# Patient Record
Sex: Female | Born: 1975 | Race: White | Hispanic: No | Smoking: Never smoker
Health system: Southern US, Community
[De-identification: ages and names within clinical notes are randomized; demographics above are authoritative.]

## PROBLEM LIST (undated history)

## (undated) DIAGNOSIS — D649 Anemia, unspecified: Secondary | ICD-10-CM

## (undated) HISTORY — PX: WISDOM TOOTH EXTRACTION: SHX21

---

## 2012-07-28 HISTORY — PX: OTHER SURGICAL HISTORY: SHX169

## 2016-01-02 ENCOUNTER — Ambulatory Visit: Payer: Self-pay | Admitting: Osteopathic Medicine

## 2017-02-27 HISTORY — PX: SINUS SURGERY WITH INSTATRAK: SHX5215

## 2017-04-07 ENCOUNTER — Ambulatory Visit (INDEPENDENT_AMBULATORY_CARE_PROVIDER_SITE_OTHER): Payer: BLUE CROSS/BLUE SHIELD | Admitting: Osteopathic Medicine

## 2017-04-07 ENCOUNTER — Other Ambulatory Visit (HOSPITAL_COMMUNITY)
Admission: RE | Admit: 2017-04-07 | Discharge: 2017-04-07 | Disposition: A | Discharge status: Home / Self Care | Payer: BLUE CROSS/BLUE SHIELD | Source: Ambulatory Visit | Attending: Osteopathic Medicine | Admitting: Osteopathic Medicine

## 2017-04-07 ENCOUNTER — Encounter: Payer: Self-pay | Admitting: Osteopathic Medicine

## 2017-04-07 VITALS — BP 118/78 | HR 96 | Ht 62.0 in | Wt 148.0 lb

## 2017-04-07 DIAGNOSIS — N920 Excessive and frequent menstruation with regular cycle: Secondary | ICD-10-CM | POA: Diagnosis not present

## 2017-04-07 DIAGNOSIS — Z8249 Family history of ischemic heart disease and other diseases of the circulatory system: Secondary | ICD-10-CM | POA: Insufficient documentation

## 2017-04-07 DIAGNOSIS — Z0000 Encounter for general adult medical examination without abnormal findings: Secondary | ICD-10-CM

## 2017-04-07 DIAGNOSIS — R768 Other specified abnormal immunological findings in serum: Secondary | ICD-10-CM | POA: Insufficient documentation

## 2017-04-07 DIAGNOSIS — Z8349 Family history of other endocrine, nutritional and metabolic diseases: Secondary | ICD-10-CM | POA: Diagnosis not present

## 2017-04-07 DIAGNOSIS — R7301 Impaired fasting glucose: Secondary | ICD-10-CM | POA: Diagnosis not present

## 2017-04-07 NOTE — Progress Notes (Addendum)
HPI: Cynthia Weber is a 41 y.o. female  who presents to [facility] today, 04/08/17,  for chief complaint of:  Chief Complaint  Patient presents with  . Establish Care      Patient here for annual physical / wellness exam.  See preventive care reviewed as below.  Recent labs reviewed in detail with the patient - see [place] / [place] records.   Additional concerns today include:  None   Past medical, surgical, social and family history reviewed: Patient Active Problem List   Diagnosis Date Noted  . Menorrhagia with regular cycle 04/07/2017  . Family history of MI (myocardial infarction) 04/07/2017  . Family history of thyroid disease 04/07/2017  . 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 SINUS SURGERY WITH INSTATRAK  02/27/2017  . tummy tuck  2014     Social History  Substance Use Topics  . Smoking status: Never Smoker  . Smokeless tobacco: Never Used  . Alcohol use Not on file   Family History  Problem Relation Age of Onset  . Thyroid cancer Mother   . Heart attack Father      Current medication list and allergy/intolerance information reviewed:   No current outpatient prescriptions on file.   No current facility-administered medications for this visit.    Allergies  Allergen Reactions  . No Known Allergies       Review of Systems:  Constitutional:  No  fever, no chills, No recent illness, No unintentional weight changes. No significant fatigue.   HEENT: No  headache, no vision change, no hearing change, No sore throat, No  sinus pressure  Cardiac: No  chest pain, No  pressure, No palpitations, No  Orthopnea  Respiratory:  No  shortness of breath. No  Cough  Gastrointestinal: No  abdominal pain, No  nausea, No  vomiting,  No  blood in stool, No  diarrhea, No  constipation   Musculoskeletal: No new myalgia/arthralgia  Genitourinary:  No  incontinence, +abnormal genital bleeding - heavy periods lately, No abnormal genital discharge  Skin: No  Rash, No other wounds/concerning lesions  Hem/Onc: No  easy bruising/bleeding, No  abnormal lymph node  Endocrine: No cold intolerance,  No heat intolerance. No polyuria/polydipsia/polyphagia   Neurologic: No  weakness, No  dizziness, No  slurred speech/focal weakness/facial droop  Psychiatric: No  concerns with depression, No  concerns with anxiety, No sleep problems, No mood problems  Exam:  BP 118/78   Pulse 96   Ht 5\' 2"  (1.575 m)   Wt 148 lb (67.1 kg)   BMI 27.07 kg/m   Constitutional: VS see above. General Appearance: alert, well-developed, well-nourished, NAD  Eyes: Normal lids and conjunctive, non-icteric sclera  Ears, Nose, Mouth, Throat: MMM, Normal external inspection ears/nares/mouth/lips/gums. TM normal bilaterally. Pharynx/tonsils no erythema, no exudate. Nasal mucosa normal.   Neck: No masses, trachea midline. No thyroid enlargement. No tenderness/mass appreciated. No lymphadenopathy  Respiratory: Normal respiratory effort. no wheeze, no rhonchi, no rales  Cardiovascular: S1/S2 normal, no murmur, no rub/gallop auscultated. RRR. No lower extremity edema.  Gastrointestinal: Nontender, no masses. No hepatomegaly, no splenomegaly. No hernia appreciated. Bowel sounds normal. Rectal exam deferred.   Musculoskeletal: Gait normal. No clubbing/cyanosis of digits.   Neurological: Normal balance/coordination. No tremor. No cranial nerve deficit on limited exam. Motor and sensation intact and symmetric. Cerebellar reflexes intact.   Skin: warm, dry, intact. No rash/ulcer. No concerning nevi or subq  nodules on limited exam.    Psychiatric: Normal judgment/insight. Normal mood and affect. Oriented x3.  GYN: No lesions/ulcers to external genitalia, normal urethra, normal vaginal mucosa, physiologic discharge, cervix normal without lesions, uterus not enlarged or  tender, adnexa no masses and nontender  BREAST: No rashes/skin changes, normal fibrous breast tissue, no masses or tenderness, normal nipple without discharge, normal axilla     ASSESSMENT/PLAN:   Annual physical exam - Plan: CBC, COMPLETE METABOLIC PANEL WITH GFR, Lipid panel, TSH, T4, free, Cytology - PAP  Menorrhagia with regular cycle  Family history of MI (myocardial infarction)  Family history of thyroid disease  HSV-2 seropositive - w/o hx outbreak - no viral culture of active lesion    FEMALE PREVENTIVE CARE Updated 04/08/17   ANNUAL SCREENING/COUNSELING  Diet/Exercise - HEALTHY HABITS DISCUSSED TO DECREASE CV RISK History  Smoking Status  . Never Smoker  Smokeless Tobacco  . Never Used   History  Alcohol use Not on file   Depression screen [facility] 2/9 04/07/2017  Decreased Interest 0  Down, Depressed, Hopeless 0  PHQ - 2 Score 0    Domestic violence concerns - no  HTN SCREENING - SEE VITALS  SEXUAL HEALTH  Need/want STI testing today? - no  Concerns about libido or pain with sex? - no  Plans for pregnancy? - no  INFECTIOUS DISEASE SCREENING  HIV - does not need  GC/CT - does not need  HepC - DOB 1944-1965 - does not need  TB - does not need  DISEASE SCREENING  Lipid - needs  DM2 - needs  Osteoporosis - women age 39+ - does not need  CANCER SCREENING  Cervical - needs  Breast - does not need  Lung - does not need  Colon - does not need  ADULT VACCINATION  Influenza - annual vaccine recommended  Td - booster every 10 years   Zoster - option at 68, yes at 60+   PCV13 - was not indicated  PPSV23 - was not indicated  There is no immunization history on file for this patient.   Patient Instructions  Plan:  Labs and Pap today - results should be back within a week  Start mammograms at 37 or can wait until age 80   Can try Ibuprofen 800 mg every 6-8 hours just prior to and during periods - if persistent heavy bleeding  or anemia, we should consider a few months on birth control pills     Visit summary with medication list and pertinent instructions was printed for patient to review. All questions at time of visit were answered - patient instructed to contact office with any additional concerns. ER/RTC precautions were reviewed with the patient. Follow-up plan: Return in about 1 year (around 04/07/2018) for [employer], SOONER IF NEEDED FOR ANY PROBLEMS WHICH MAY ARISE! Marland Kitchen

## 2017-04-07 NOTE — Patient Instructions (Signed)
Plan:  Labs and Pap today - results should be back within a week  Start mammograms at 23 or can wait until age 41   Can try Ibuprofen 800 mg every 6-8 hours just prior to and during periods - if persistent heavy bleeding or anemia, we should consider a few months on birth control pills

## 2017-04-08 ENCOUNTER — Encounter: Payer: Self-pay | Admitting: Osteopathic Medicine

## 2017-04-09 ENCOUNTER — Encounter: Payer: Self-pay | Admitting: Osteopathic Medicine

## 2017-04-09 LAB — COMPLETE METABOLIC PANEL WITH GFR
AG Ratio: 1.8 (calc) (ref 1.0–2.5)
ALKALINE PHOSPHATASE (APISO): 68 U/L (ref 33–115)
ALT: 28 U/L (ref 6–29)
AST: 24 U/L (ref 10–30)
Albumin: 4.5 g/dL (ref 3.6–5.1)
BILIRUBIN TOTAL: 0.6 mg/dL (ref 0.2–1.2)
BUN: 10 mg/dL (ref 7–25)
CHLORIDE: 106 mmol/L (ref 98–110)
CO2: 24 mmol/L (ref 20–32)
Calcium: 9.7 mg/dL (ref 8.6–10.2)
Creat: 0.82 mg/dL (ref 0.50–1.10)
GFR, Est African American: 104 mL/min/{1.73_m2} (ref >=60)
GFR, Est Non African American: 90 mL/min/{1.73_m2} (ref >=60)
GLUCOSE: 100 mg/dL — AB (ref 65–99)
Globulin: 2.5 g/dL (calc) (ref 1.9–3.7)
Potassium: 4.3 mmol/L (ref 3.5–5.3)
Sodium: 139 mmol/L (ref 135–146)
Total Protein: 7 g/dL (ref 6.1–8.1)

## 2017-04-09 LAB — CBC
HCT: 36.2 % (ref 35.0–45.0)
Hemoglobin: 12 g/dL (ref 11.7–15.5)
MCH: 29.6 pg (ref 27.0–33.0)
MCHC: 33.1 g/dL (ref 32.0–36.0)
MCV: 89.2 fL (ref 80.0–100.0)
MPV: 12 fL (ref 7.5–12.5)
PLATELETS: 288 10*3/uL (ref 140–400)
RBC: 4.06 10*6/uL (ref 3.80–5.10)
RDW: 12.6 % (ref 11.0–15.0)
WBC: 6 10*3/uL (ref 3.8–10.8)

## 2017-04-09 LAB — LIPID PANEL
CHOLESTEROL: 160 mg/dL (ref <200)
HDL: 39 mg/dL — ABNORMAL LOW (ref >50)
LDL CHOLESTEROL (CALC): 106 mg/dL — AB
Non-HDL Cholesterol (Calc): 121 mg/dL (calc) (ref <130)
TRIGLYCERIDES: 64 mg/dL (ref <150)
Total CHOL/HDL Ratio: 4.1 (calc) (ref <5.0)

## 2017-04-09 LAB — CYTOLOGY - PAP
Diagnosis: NEGATIVE
HPV: NOT DETECTED

## 2017-04-09 LAB — TEST AUTHORIZATION

## 2017-04-09 LAB — HEMOGLOBIN A1C W/OUT EAG: Hgb A1c MFr Bld: 4.8 % of total Hgb (ref <5.7)

## 2017-04-09 LAB — TSH: TSH: 1.24 mIU/L

## 2017-04-09 LAB — T4, FREE: FREE T4: 1.1 ng/dL (ref 0.8–1.8)

## 2017-04-15 ENCOUNTER — Encounter: Payer: Self-pay | Admitting: Family Medicine

## 2017-04-15 ENCOUNTER — Ambulatory Visit (INDEPENDENT_AMBULATORY_CARE_PROVIDER_SITE_OTHER): Payer: BLUE CROSS/BLUE SHIELD | Admitting: Family Medicine

## 2017-04-15 VITALS — BP 123/86 | HR 87 | Wt 147.0 lb

## 2017-04-15 DIAGNOSIS — M25561 Pain in right knee: Secondary | ICD-10-CM | POA: Diagnosis not present

## 2017-04-15 DIAGNOSIS — G5623 Lesion of ulnar nerve, bilateral upper limbs: Secondary | ICD-10-CM

## 2017-04-15 DIAGNOSIS — M25562 Pain in left knee: Secondary | ICD-10-CM | POA: Diagnosis not present

## 2017-04-15 DIAGNOSIS — M62838 Other muscle spasm: Secondary | ICD-10-CM

## 2017-04-15 MED ORDER — GABAPENTIN 300 MG PO CAPS: ORAL_CAPSULE | ORAL | 3 refills | Status: DC

## 2017-04-15 NOTE — Patient Instructions (Addendum)
Thank you for coming in today. I suspect your symptoms are due to Cubital Tunnel Syndrome.  Work on the night splints on your elbow.  Use the towel method or an elbow pad turned around.   Take gabapentin mostly at night.   Recheck with me in 1 month or sooner if needed.   You can call [facility] now and schedule nerve study.  [phone]  Cubital Tunnel Syndrome Cubital tunnel syndrome is a condition that causes pain and weakness of the forearm and hand. This condition happens when one of the nerves (ulnar nerve) that runs alongside the elbow joint becomes irritated. What are the causes? Causes of this condition include:  Increased pressure on the ulnar nerve at the elbow, arm, or forearm. This can be caused by: ? Swollen tissues. ? Ligaments. ? Muscles. ? Poorly healed elbow fractures. ? Tumors in the elbow. These are usually noncancerous (benign). ? Scar tissue that develops in the elbow after an injury. ? Bony growths (spurs) near the ulnar nerve.  Stretching of the nerve due to loose elbow ligaments.  Trauma to the nerve at the elbow.  Repetitive elbow bending.  Certain medical conditions.  What increases the risk? This condition is more likely to develop in:  People who do manual labor that requires frequently bending the elbow.  People who play sports that include repeated or strenuous throwing motions, such as baseball.  People who play contact sports, such as football or lacrosse.  People who do not warm up properly before activities.  People who have diabetes.  People who have an underactive thyroid (hypothyroidism).  What are the signs or symptoms? Symptoms of this condition include:  Clumsiness or weakness of the hand.  Tenderness of the inner elbow.  Aching or soreness of the inner elbow, forearm, or fingers, especially the little finger or the ring finger.  Increased pain with forced elbow bending.  Reduced control when  throwing.  Tingling, numbness, or burning inside the forearm, or in part of the hand or fingers, especially the little finger or the ring finger.  Sharp pains that shoot from the elbow down to the wrist and hand.  The inability to grip or pinch hard.  How is this diagnosed? This condition is diagnosed with a medical history and physical exam. Your health care provider will ask about your symptoms and ask for details about any injury. You may also have other tests, including:  Electromyogram (EMG). This test checks how well the nerve is working.  X-ray.  How is this treated? Treatment starts by stopping the activities that are causing your symptoms to get worse. Treatment may include the use of icing and medicines to reduce pain and swelling. You may also be advised to wear a splint to prevent your elbow from bending or wear an elbow pad where the ulnar nerve is closest to the skin. In less severe cases, treatment may also include working with a physical therapist:  To help decrease your symptoms.  To improve the strength and range of motion of your elbow, forearm, and hand.  If the treatments described above do not help, surgery may be needed. Follow these instructions at home: If you have a splint:  Wear it as told by your health care provider. Remove it only as told by your health care provider.  Loosen the splint if your fingers become numb and tingle, or if they turn cold and blue.  Keep the splint clean and dry. Managing pain, stiffness, and  swelling  If directed, apply ice to the injured area: ? Put ice in a plastic bag. ? Place a towel between your skin and the bag. ? Leave the ice on for 20 minutes, 2-3 times per day.  Move your fingers often to avoid stiffness and to lessen swelling.  Raise (elevate) the injured area above the level of your heart while you are sitting or lying down. General instructions  Take over-the-counter and prescription medicines only as told  by your health care provider.  Keep all follow-up visits as told by your health care provider. This is important.  Do any exercise or physical therapy as told by your health care provider.  Do not drive or operate heavy machinery while taking prescription pain medicine.  If you were given an elbow pad, wear it as told by your health care provider. Contact a health care provider if:  Your symptoms get worse.  Your symptoms do not get better with treatment.  Your have new pain.  Your hand on the injured side feels numb or cold. This information is not intended to replace advice given to you by your health care provider. Make sure you discuss any questions you have with your health care provider. Document Released: 07/14/2005 Document Revised: 12/20/2015 Document Reviewed: 09/20/2014 Elsevier Interactive Patient Education  [employer].
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
## 2017-04-23 ENCOUNTER — Encounter (INDEPENDENT_AMBULATORY_CARE_PROVIDER_SITE_OTHER): Payer: Self-pay | Admitting: Diagnostic Neuroimaging

## 2017-04-23 ENCOUNTER — Ambulatory Visit (INDEPENDENT_AMBULATORY_CARE_PROVIDER_SITE_OTHER): Payer: BLUE CROSS/BLUE SHIELD | Admitting: Diagnostic Neuroimaging

## 2017-04-23 DIAGNOSIS — M25562 Pain in left knee: Secondary | ICD-10-CM

## 2017-04-23 DIAGNOSIS — G5623 Lesion of ulnar nerve, bilateral upper limbs: Secondary | ICD-10-CM

## 2017-04-23 DIAGNOSIS — Z0289 Encounter for other administrative examinations: Secondary | ICD-10-CM

## 2017-04-23 DIAGNOSIS — M25561 Pain in right knee: Secondary | ICD-10-CM

## 2017-04-24 NOTE — Procedures (Signed)
[facility]  NCS (NERVE CONDUCTION STUDY) WITH EMG (ELECTROMYOGRAPHY) REPORT   STUDY DATE: 04/23/17 PATIENT NAME: Cynthia Weber DOB: Apr 22, 1976 MRN: [id number]  ORDERING CLINICIAN: Lynne Leader, MD  TECHNOLOGIST: Oneita Jolly ELECTROMYOGRAPHER: Earlean Polka. Margreat Widener, MD  CLINICAL INFORMATION: 41 year old female with intermittent numbness and pain in bilateral upper extremities. Patient has left elbow pain. Evaluate for left cubital tunnel syndrome or other explanation of bilateral upper extremity symptoms.  FINDINGS: NERVE CONDUCTION STUDY: Bilateral median motor responses are normal. Bilateral ulnar motor responses are normal.  Right median sensory response has slightly prolonged peak latency and slightly decreased amplitude.  Left median and bilateral ulnar sensory responses are normal.  Right median to ulnar transcarpal comparison study shows prolonged peak latency difference.  Left median to ulnar transcarpal comparison study is normal.   NEEDLE ELECTROMYOGRAPHY:  Needle examination of left upper extremity deltoid, biceps, triceps, flexor carpi radialis, first dorsal interosseous and left cervical paraspinal muscles are normal.   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 - No electrodiagnostic evidence of LEFT cubital tunnel syndrome as queried. No evidence of neuropathy or cervical radiculopathy affecting the left upper extremity.     INTERPRETING PHYSICIAN:  Penni Bombard, MD Certified in Neurology, Neurophysiology and Neuroimaging  [facility] [address], [place], [place] [address] [phone]   [facility]    Nerve / Sites Muscle Latency Ref. Amplitude Ref. Rel Amp Segments Distance Velocity Ref. Area    ms ms mV mV %  cm m/s m/s mVms  R Median - APB     Wrist APB 4.0 ?4.4 6.4 ?4.0 100 Wrist - APB 7   23.0    Upper arm APB 7.4  6.2  96.9 Upper arm - Wrist 20 58 ?49 21.3  L Median - APB     Wrist APB 3.3 ?4.4 7.8 ?4.0 100 Wrist - APB 7   31.8     Upper arm APB 6.6  7.3  94.2 Upper arm - Wrist 20 61 ?49 30.4  R Ulnar - ADM     Wrist ADM 2.2 ?3.3 11.0 ?6.0 100 Wrist - ADM 7   45.7     B.Elbow ADM 5.2  10.5  95.4 B.Elbow - Wrist 18 61 ?49 44.4     A.Elbow ADM 6.8  9.7  92.8 A.Elbow - B.Elbow 10 62 ?49 42.3         A.Elbow - Wrist      L Ulnar - ADM     Wrist ADM 2.3 ?3.3 11.0 ?6.0 100 Wrist - ADM 7   38.0     B.Elbow ADM 5.3  9.2  83.6 B.Elbow - Wrist 18 61 ?49 34.3     A.Elbow ADM 7.1  9.0  98 A.Elbow - B.Elbow 10 53 ?49 31.9         A.Elbow - Wrist                 SNC    Nerve / Sites Rec. Site Peak Lat Ref.  Amp Ref. Segments Distance Peak Diff Ref.    ms ms V V  cm ms ms  R Median, Ulnar - Transcarpal comparison     Median Palm Wrist 2.5 ?2.2 55 ?35 Median Palm - Wrist 8       Ulnar Palm Wrist 1.9 ?2.2 27 ?12 Ulnar Palm - Wrist 8  Median Palm - Ulnar Palm  0.6 ?0.4  L Median, Ulnar - Transcarpal comparison     Median Palm Wrist 2.1 ?2.2 47 ?35 Median Palm - Wrist 8       Ulnar Palm Wrist 1.9 ?2.2 24 ?12 Ulnar Palm - Wrist 8          Median Palm - Ulnar Palm  0.2 ?0.4  R Median - Orthodromic (Dig II, Mid palm)     Dig II Wrist 3.5 ?3.4 9 ?10 Dig II - Wrist 13    L Median - Orthodromic (Dig II, Mid palm)     Dig II Wrist 2.9 ?3.4 13 ?10 Dig II - Wrist 13    R Ulnar - Orthodromic, (Dig V, Mid palm)     Dig V Wrist 2.4 ?3.1 16 ?5 Dig V - Wrist 11    L Ulnar - Orthodromic, (Dig V, Mid palm)     Dig V Wrist 2.6 ?3.1 11 ?5 Dig V - Wrist 20                   F  Wave    Nerve F Lat Ref.   ms ms  R Ulnar - ADM 25.3 ?32.0  L Ulnar - ADM 25.2 ?32.0         EMG full       EMG Summary Table    Spontaneous MUAP Recruitment  Muscle IA Fib PSW Fasc Other Amp Dur. Poly Pattern  L. Deltoid Normal None None None _______ Normal Normal Normal Normal  L. Biceps brachii Normal None  None None _______ Normal Normal Normal Normal  L. Triceps brachii Normal None None None _______ Normal Normal Normal Normal  L. Flexor carpi radialis Normal None None None _______ Normal Normal Normal Normal  L. First dorsal interosseous Normal None None None _______ Normal Normal Normal Normal  L. Cervical paraspinals Normal None None None _______ Normal Normal Normal Normal

## 2017-04-27 ENCOUNTER — Encounter: Payer: Self-pay | Admitting: Podiatry

## 2017-04-27 ENCOUNTER — Telehealth: Payer: Self-pay | Admitting: Family Medicine

## 2017-04-27 ENCOUNTER — Ambulatory Visit (INDEPENDENT_AMBULATORY_CARE_PROVIDER_SITE_OTHER): Payer: BLUE CROSS/BLUE SHIELD | Admitting: Podiatry

## 2017-04-27 VITALS — BP 136/83 | HR 83 | Ht 62.0 in | Wt 145.0 lb

## 2017-04-27 DIAGNOSIS — M5412 Radiculopathy, cervical region: Secondary | ICD-10-CM

## 2017-04-27 DIAGNOSIS — M7741 Metatarsalgia, right foot: Secondary | ICD-10-CM

## 2017-04-27 DIAGNOSIS — M21969 Unspecified acquired deformity of unspecified lower leg: Secondary | ICD-10-CM | POA: Diagnosis not present

## 2017-04-27 DIAGNOSIS — M216X2 Other acquired deformities of left foot: Secondary | ICD-10-CM

## 2017-04-27 DIAGNOSIS — M216X1 Other acquired deformities of right foot: Secondary | ICD-10-CM | POA: Diagnosis not present

## 2017-04-27 NOTE — Patient Instructions (Signed)
Seen for pain in ball of right foot. Noted of hyermobile first ray bilateral. Metatarsal binder dispensed. Return for custom orthotics.

## 2017-04-27 NOTE — Progress Notes (Signed)
SUBJECTIVE: 41 y.o. year old female presents complaining of pain under the ball of right foot at about 2nd and 3rd metatarsophalangeal joint area duration of a few months. The pain can radiate proximally to the foot. Happens about every other day. But tenderness is always present. Does exercise everyday.  REVIEW OF SYSTEMS: Pertinent items noted in HPI and remainder of comprehensive ROS otherwise negative.  OBJECTIVE: DERMATOLOGIC EXAMINATION: Normal findings.  VASCULAR EXAMINATION OF LOWER LIMBS: All pedal pulses are palpable with normal pulsation.  Capillary Filling times within 3 seconds in all digits.  Temperature gradient from tibial crest to dorsum of foot is within normal bilateral.  NEUROLOGIC EXAMINATION OF THE LOWER LIMBS: All epicritic and tactile sensations grossly intact. Sharp and Dull discriminatory sensations at the plantar ball of hallux is intact bilateral.   MUSCULOSKELETAL EXAMINATION: Positive for high arched foot with excess sagittal plane motion of the first ray bilateral. Plantar flexed 2-3 MPJ bilateral with pain in right foot with weight bearing.   ASSESSMENT: Hypermobile first ray bilateral with lateral weight shifting. Lesser metatarsalgia right. Compensated rearfoot varus bilateral.  PLAN: Reviewed findings and available treatment options. Metatarsal binder dispensed to add stability of mid foot. Proper shoe gear reviewed. May benefit from custom orthotics. Patient will return for custom orthotics.

## 2017-04-27 NOTE — Telephone Encounter (Signed)
Pt came in the office today and would like to know if you have seen her notes from neuro and if so are you going to order and MRI. If so she would like a referral sent so she can go ahead and get it done before her follow up appointment with you on Oct. 15th. Thanks

## 2017-04-28 NOTE — Telephone Encounter (Signed)
Cervical MRI ordered.

## 2017-04-28 NOTE — Telephone Encounter (Signed)
Left message advising of order.

## 2017-04-29 ENCOUNTER — Ambulatory Visit (INDEPENDENT_AMBULATORY_CARE_PROVIDER_SITE_OTHER): Payer: BLUE CROSS/BLUE SHIELD | Admitting: Physical Therapy

## 2017-04-29 ENCOUNTER — Encounter: Payer: Self-pay | Admitting: Physical Therapy

## 2017-04-29 DIAGNOSIS — M6281 Muscle weakness (generalized): Secondary | ICD-10-CM

## 2017-04-29 DIAGNOSIS — R29898 Other symptoms and signs involving the musculoskeletal system: Secondary | ICD-10-CM
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
## 2017-04-29 NOTE — Patient Instructions (Signed)
  Scapula Adduction With Pectorals, Low   Stand in doorframe with palms against frame and arms at 45. Lean forward and squeeze shoulder blades. Hold __30_ seconds. Repeat _2__ times per session. Do __2_ sessions per day. If you get numbness or tingling stop.     Marland Kitchen

## 2017-05-04 ENCOUNTER — Ambulatory Visit (INDEPENDENT_AMBULATORY_CARE_PROVIDER_SITE_OTHER): Payer: BLUE CROSS/BLUE SHIELD

## 2017-05-04 DIAGNOSIS — M50122 Cervical disc disorder at C5-C6 level with radiculopathy: Secondary | ICD-10-CM | POA: Diagnosis not present

## 2017-05-04 DIAGNOSIS — M5412 Radiculopathy, cervical region: Secondary | ICD-10-CM

## 2017-05-05 ENCOUNTER — Encounter: Payer: BLUE CROSS/BLUE SHIELD | Admitting: Physical Therapy

## 2017-05-06 ENCOUNTER — Ambulatory Visit (INDEPENDENT_AMBULATORY_CARE_PROVIDER_SITE_OTHER): Payer: BLUE CROSS/BLUE SHIELD | Admitting: Physical Therapy

## 2017-05-06 ENCOUNTER — Encounter: Payer: Self-pay | Admitting: Physical Therapy

## 2017-05-06 DIAGNOSIS — R29898 Other symptoms and signs involving the musculoskeletal system: Secondary | ICD-10-CM

## 2017-05-06 DIAGNOSIS — M6281 Muscle weakness (generalized): Secondary | ICD-10-CM

## 2017-05-06 NOTE — Therapy (Signed)
[facility] [place], [place], [address] Phone: [phone]   Fax:  [phone]  Physical Therapy Treatment  Patient Details  Name: Cynthia Weber MRN: [id number] Date of Birth: 12-Nov-1975 Referring Provider: Dr Steva Colder  Encounter Date: 05/06/2017      PT End of Session - 05/06/17 0846    Visit Number 2   Number of Visits 12   Date for PT Re-Evaluation 06/10/17   Authorization Type 30 visits per year.    PT Start Time 0847   PT Stop Time 0931   PT Time Calculation (min) 44 min   Activity Tolerance Patient tolerated treatment well      History reviewed. No pertinent past medical history.  Past Surgical History:  Procedure Laterality Date  . SINUS SURGERY WITH INSTATRAK  02/27/2017  . tummy tuck  2014    There were no vitals filed for this visit.      Subjective Assessment - 05/06/17 0848    Subjective Pt had MRI, results are in her mychart, sees MD on Monday.  No change in her symptoms. with daily activity and stretches.    Diagnostic tests nerve conduction study Rt carpal tunnel very minimal.   MRI on Monday   Patient Stated Goals stop hand numbess, write without difficulty   Currently in Pain? Yes   Pain Score 4    Pain Location Elbow   Pain Orientation Left   Pain Descriptors / Indicators Throbbing;Tightness;Sore   Pain Type Chronic pain   Pain Onset More than a month ago   Pain Frequency Constant   Aggravating Factors  sleeping, reaching   Pain Relieving Factors nothing                          [facility] Adult PT Treatment/Exercise - 05/06/17 0001      Neck Exercises: Machines for Strengthening   UBE (Upper Arm Bike) L2x4' alt FWD/BWD     Neck Exercises: Seated   Neck Retraction 10 reps  VC for form and to keep away from pain     Neck Exercises: Supine   Shoulder Flexion Both;20 reps  overhead with red band   Other Supine Exercise 20 reps horizontal abduction  with red band.      Modalities   Modalities Traction     Traction   Type of Traction Cervical   Min (lbs) 6   Max (lbs) 13   Hold Time 60   Rest Time 20   Time 19     Neck Exercises: Stretches   Upper Trapezius Stretch 2 reps;20 seconds  each side                     PT Long Term Goals - 05/06/17 0903      PT LONG TERM GOAL #1   Title I with HEP for nerve glides ( 06/10/17)    Status On-going     PT LONG TERM GOAL #2   Title improve FOTO =/< 38% limited ( 06/10/17)    Status On-going     PT LONG TERM GOAL #3   Title demo Rt grip strength =/> Lt and no reports of feeling like she will drop something ( 06/10/17)    Status On-going     PT LONG TERM GOAL #4   Title report =/> 75% reduction in reports of numbness and tingling in her arms ( 06/10/17)    Status On-going  PT LONG TERM GOAL #5   Title report ability to sleep per her previous level and not wake up due to numbness/tingling in her arms ( 06/10/17)    Status On-going     PT LONG TERM GOAL #6   Title demo strong contraction of multifidi and TA to stabilize core ( 06/10/17)    Status On-going               Plan - 05/06/17 0906    Clinical Impression Statement This is Semiah's second visit, no goals met. She is having the same amount of symptoms as last week. Her MRI came back and she has a small bulge at C5-6.  Will perform trial of cervical traction and see if that will take pressure off to relieve the arm symptoms.    Rehab Potential Good   PT Frequency 2x / week   PT Duration 6 weeks   PT Treatment/Interventions Moist Heat;Traction;Ultrasound;Therapeutic exercise;Dry needling;Manual techniques;Neuromuscular re-education;Cryotherapy;Electrical Stimulation;Patient/family education;Passive range of motion   PT Next Visit Plan assess response to cervical traction, increase pull if tolerated well, begin cervical stabilzation ex, manual work PRN   Consulted and Agree with Plan of Care  Patient      Patient will benefit from skilled therapeutic intervention in order to improve the following deficits and impairments:  Decreased range of motion, Impaired UE functional use, Pain, Decreased strength  Visit Diagnosis: Muscle weakness (generalized)  Other symptoms and signs involving the musculoskeletal system     Problem List Patient Active Problem List   Diagnosis Date Noted  . Menorrhagia with regular cycle 04/07/2017  . Family history of MI (myocardial infarction) 04/07/2017  . Family history of thyroid disease 04/07/2017  . 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

## 2017-05-06 NOTE — Patient Instructions (Addendum)
Flexibility: Neck Retraction - don't push into pain.     Pull head straight back, keeping eyes and jaw level. Lengthen up through head.  Repeat __10__ times per set. Do _1___ sets per session. Do __3-4__ sessions per day.  Flexibility: Upper Trapezius Stretch - don't have to apply over pressure on your head if you are having numbness in the arm.     Gently grasp right side of head while reaching behind back with other hand. Tilt head away until a gentle stretch is felt. Hold __10-20__ seconds. Repeat __2__ times per set. Do __1__ sets per session. Do __3-4__ sessions per day. Repeat on both sides, unless symptoms increase in arm.

## 2017-05-08 ENCOUNTER — Encounter: Payer: Self-pay | Admitting: Sports Medicine

## 2017-05-08 ENCOUNTER — Ambulatory Visit (INDEPENDENT_AMBULATORY_CARE_PROVIDER_SITE_OTHER): Payer: BLUE CROSS/BLUE SHIELD | Admitting: Physical Therapy

## 2017-05-08 ENCOUNTER — Ambulatory Visit (INDEPENDENT_AMBULATORY_CARE_PROVIDER_SITE_OTHER): Payer: BLUE CROSS/BLUE SHIELD | Admitting: Sports Medicine

## 2017-05-08 ENCOUNTER — Ambulatory Visit (INDEPENDENT_AMBULATORY_CARE_PROVIDER_SITE_OTHER): Payer: BLUE CROSS/BLUE SHIELD

## 2017-05-08 DIAGNOSIS — M6281 Muscle weakness (generalized): Secondary | ICD-10-CM

## 2017-05-08 DIAGNOSIS — S99921A Unspecified injury of right foot, initial encounter: Secondary | ICD-10-CM

## 2017-05-08 DIAGNOSIS — X58XXXA Exposure to other specified factors, initial encounter: Secondary | ICD-10-CM | POA: Diagnosis not present

## 2017-05-08 DIAGNOSIS — R29898 Other symptoms and signs involving the musculoskeletal system: Secondary | ICD-10-CM | POA: Diagnosis not present

## 2017-05-08 NOTE — Assessment & Plan Note (Signed)
Kicked an object yesterday while chasing a puppy. Significant bruising, pain, no fractures, buddy taped toes #2 and 3. Return as needed.

## 2017-05-08 NOTE — Progress Notes (Signed)
   Subjective:    I'm seeing this patient as a consultation for:  Dr. [name]  CC: Right foot injury  HPI: This is a very pleasant 41 year old female, yesterday she was chasing the puppy, unfortunately stubbed her toes, had immediate pain, swelling, bruising without deformity. She is here for further evaluation and definitive treatment, pain is localized at the second and third toes at the proximal phalanges, moderate, persistent without radiation.  Past medical history, Surgical history, Family history not pertinant except as noted below, Social history, Allergies, and medications have been entered into the medical record, reviewed, and no changes needed.   Review of Systems: No headache, visual changes, nausea, vomiting, diarrhea, constipation, dizziness, abdominal pain, skin rash, fevers, chills, night sweats, weight loss, swollen lymph nodes, body aches, joint swelling, muscle aches, chest pain, shortness of breath, mood changes, visual or auditory hallucinations.   Objective:   General: Well Developed, well nourished, and in no acute distress.  Neuro:  Extra-ocular muscles intact, able to move all 4 extremities, sensation grossly intact.  Deep tendon reflexes tested were normal. Psych: Alert and oriented, mood congruent with affect. ENT:  Ears and nose appear unremarkable.  Hearing grossly normal. Neck: Unremarkable overall appearance, trachea midline.  No visible thyroid enlargement. Eyes: Conjunctivae and lids appear unremarkable.  Pupils equal and round. Skin: Warm and dry, no rashes noted.  Cardiovascular: Pulses palpable, no extremity edema. Right Foot: Bruising and swelling over the second and third toes, tenderness at the proximal phalanges Range of motion is full in all directions. Strength is 5/5 in all directions. No hallux valgus. No pes cavus or pes planus. No abnormal callus noted. No pain over the navicular prominence, or base of fifth metatarsal. No  tenderness to palpation of the calcaneal insertion of plantar fascia. No pain at the Achilles insertion. No pain over the calcaneal bursa. No pain of the retrocalcaneal bursa. No tenderness to palpation over the tarsals, metatarsals, or phalanges. No hallux rigidus or limitus. No tenderness palpation over interphalangeal joints. No pain with compression of the metatarsal heads. Neurovascularly intact distally.  X-rays negative for fracture.  Impression and Recommendations:   This case required medical decision making of moderate complexity.  Right foot injury Kicked an object yesterday while chasing a puppy. Significant bruising, pain, no fractures, buddy taped toes #2 and 3. Return as needed.  ___________________________________________ Gwen Her. Dianah Field, M.D., ABFM., CAQSM. Primary Care and [place] Instructor of [place] [employer] [facility] [employer] [facility]
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
## 2017-05-08 NOTE — Patient Instructions (Signed)
Over Head Pull: Narrow Grip     Perform 1-3 sets of these exercises.     On back, knees bent, feet flat, band across thighs, elbows straight but relaxed. Pull hands apart (start). Keeping elbows straight, bring arms up and over head, hands toward floor. Keep pull steady on band. Hold momentarily. Return slowly, keeping pull steady, back to start. Repeat _10__ times. Band color _red_____   Side Pull: Double Arm   On back, knees bent, feet flat. Arms perpendicular to body, shoulder level, elbows straight but relaxed. Pull arms out to sides, elbows straight. Resistance band comes across collarbones, hands toward floor. Hold momentarily. Slowly return to starting position. Repeat _10__ times. Band color ___red__   Elmer Picker   On back, knees bent, feet flat, left hand on left hip, right hand above left. Pull right arm DIAGONALLY (hip to shoulder) across chest. Bring right arm along head toward floor. Hold momentarily. Slowly return to starting position.  (hitch hiker position, thumb up) Repeat _10__ times. Do with left arm. Band color _red_____   Shoulder Rotation: Double Arm   On back, knees bent, feet flat, elbows tucked at sides, bent 90, hands palms up. Pull hands apart and down toward floor, keeping elbows near sides. Hold momentarily. Slowly return to starting position. Repeat _10__ times. Band color ___red___   Prudencio Pair in the [place]: Double Arm    Arms near sides, palms up. Press both arms lightly into floor, slide arms out to side and up alongside head. Keep contact with floor throughout motion. At maximal position, lengthen arms.  Relax. Slide arms back to start. Repeat __5-10  times.  [facility] [place], [place] [address]  [phone] (office) [phone] (fax)

## 2017-05-11 ENCOUNTER — Encounter: Payer: Self-pay | Admitting: Family Medicine

## 2017-05-11 ENCOUNTER — Ambulatory Visit (INDEPENDENT_AMBULATORY_CARE_PROVIDER_SITE_OTHER): Payer: BLUE CROSS/BLUE SHIELD | Admitting: Physical Therapy

## 2017-05-11 ENCOUNTER — Ambulatory Visit (INDEPENDENT_AMBULATORY_CARE_PROVIDER_SITE_OTHER): Payer: BLUE CROSS/BLUE SHIELD | Admitting: Family Medicine

## 2017-05-11 ENCOUNTER — Encounter: Payer: Self-pay | Admitting: Physical Therapy

## 2017-05-11 VITALS — Wt 150.0 lb

## 2017-05-11 DIAGNOSIS — R29898 Other symptoms and signs involving the musculoskeletal system: Secondary | ICD-10-CM

## 2017-05-11 DIAGNOSIS — M6281 Muscle weakness (generalized): Secondary | ICD-10-CM | POA: Diagnosis not present

## 2017-05-11 DIAGNOSIS — R202 Paresthesia of skin: Secondary | ICD-10-CM | POA: Diagnosis not present

## 2017-05-11 DIAGNOSIS — R200 Anesthesia of skin: Secondary | ICD-10-CM | POA: Insufficient documentation

## 2017-05-11 DIAGNOSIS — S99921A Unspecified injury of right foot, initial encounter: Secondary | ICD-10-CM | POA: Diagnosis not present

## 2017-05-11 MED ORDER — GABAPENTIN 300 MG PO CAPS: ORAL_CAPSULE | ORAL | 3 refills | Status: DC

## 2017-05-11 NOTE — Therapy (Signed)
[place], [place], [address] Phone: [phone]   Fax:  [phone]  Physical Therapy Treatment  Patient Details  Name: Cynthia Weber MRN: [id number] Date of Birth: 09/30/1975 Referring Provider: Dr Steva Colder  Encounter Date: 05/11/2017      PT End of Session - 05/11/17 1402    Visit Number 4   Number of Visits 12   Date for PT Re-Evaluation 06/10/17   Authorization Type 30 visits per year.    PT Start Time 1403   PT Stop Time 1501   PT Time Calculation (min) 58 min   Activity Tolerance Patient tolerated treatment well      History reviewed. No pertinent past medical history.  Past Surgical History:  Procedure Laterality Date  . SINUS SURGERY WITH INSTATRAK  02/27/2017  . tummy tuck  2014    There were no vitals filed for this visit.      Subjective Assessment - 05/11/17 1407    Subjective Saw MD and had orthotics made, reviewed MRI results, he wants her to continue with PT and try wearing splints on her UEs.  Also recommended gabepentin, she is not sure she wants to take it.    Patient Stated Goals stop hand numbess, write without difficulty   Currently in Pain? Yes   Pain Score 4    Pain Location Elbow   Pain Orientation Left;Right   Pain Descriptors / Indicators Throbbing   Pain Type Chronic pain   Pain Onset More than a month ago   Pain Frequency Constant   Aggravating Factors  sleeping, using her arms   Pain Relieving Factors position changes.             [facility] PT Assessment - 05/11/17 0001      Assessment   Medical Diagnosis cubital tunnel syndrome Rt, cervical radiculopathy   Referring Provider Dr Steva Colder   Onset Date/Surgical Date 10/28/16   Hand Dominance Right   Next MD Visit 05/11/17     Strength   Right Hand Grip (lbs) 65/60/65   Left Hand Grip (lbs) 64/60/57     Palpation   Spinal mobility hypomobile in upper thoracic area                      [facility] Adult PT Treatment/Exercise - 05/11/17 0001      Neck Exercises: Machines for Strengthening   UBE (Upper Arm Bike) L2x4' alt FWD/BWD     Neck Exercises: Supine   Other Supine Exercise self thoracic mobs lying over a pool noodle, moving up /down thoracic spine. Used two different diameter noodles to open her up.     Modalities   Modalities Electrical Stimulation;Moist Heat;Traction     Moist Heat Therapy   Number Minutes Moist Heat 19 Minutes   Moist Heat Location Elbow;Cervical  Rt while on traction     Electrical Stimulation   Electrical Stimulation Location Lt lateral epicondyle and distal tricep/ bilat cervical paraspinals    Electrical Stimulation Action premod, while on traction   Electrical Stimulation Parameters to tolerance   Electrical Stimulation Goals Pain     Traction   Type of Traction Cervical   Min (lbs) 8   Max (lbs) 16   Hold Time 60   Rest Time 20   Time 19     Manual Therapy   Manual Therapy Joint mobilization   Joint Mobilization grade III cervical and thoracic CPA mobs, very hypomobile T1-4,  loosened up with treatment                     PT Long Term Goals - 05/11/17 1430      PT LONG TERM GOAL #1   Title I with HEP for nerve glides ( 06/10/17)    Status Achieved     PT LONG TERM GOAL #2   Title improve FOTO =/< 38% limited ( 06/10/17)    Status On-going     PT LONG TERM GOAL #3   Title demo Rt grip strength =/> Lt and no reports of feeling like she will drop something ( 06/10/17)    Status Achieved     PT LONG TERM GOAL #4   Title report =/> 75% reduction in reports of numbness and tingling in her arms ( 06/10/17)    Status On-going     PT LONG TERM GOAL #5   Title report ability to sleep per her previous level and not wake up due to numbness/tingling in her arms ( 06/10/17)      PT LONG TERM GOAL #6   Title demo strong contraction of multifidi and TA to stabilize core ( 06/10/17)     Status On-going               Plan - 05/11/17 1448    Clinical Impression Statement Arma had relief of UE symptoms when holding his arms up after thoracic spinal mobs. She was able to perform self mobs with noodle and will perform this at home. She has met a goal, one of which is her hand strength, she has not had a loss.  She is hypomobile in the thoracic spine and would benefit from more of these.    Rehab Potential Good   PT Frequency 2x / week   PT Duration 6 weeks   PT Treatment/Interventions Moist Heat;Traction;Ultrasound;Therapeutic exercise;Dry needling;Manual techniques;Neuromuscular re-education;Cryotherapy;Electrical Stimulation;Patient/family education;Passive range of motion   PT Next Visit Plan thoracic mobs, cont traction/heat/stim   Consulted and Agree with Plan of Care Patient      Patient will benefit from skilled therapeutic intervention in order to improve the following deficits and impairments:  Decreased range of motion, Impaired UE functional use, Pain, Decreased strength  Visit Diagnosis: Other symptoms and signs involving the musculoskeletal system  Muscle weakness (generalized)     Problem List Patient Active Problem List   Diagnosis Date Noted  . Paresthesia of upper extremity 05/11/2017  . Right foot injury 05/08/2017  . Menorrhagia with regular cycle 04/07/2017  . Family history of MI (myocardial infarction) 04/07/2017  . Family history of thyroid disease 04/07/2017  . 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

## 2017-05-11 NOTE — Patient Instructions (Addendum)
Thoracic Self-Mobilization Stretch (Supine)    With small rolled towel at lower ribs level, gently lie back until stretch is felt. Hold __60-120__ seconds. Relax. Repeat ____ times per set. Do ____ sets per session. Do ____ sessions per day.

## 2017-05-11 NOTE — Progress Notes (Signed)
Cynthia Weber is a 41 y.o. female who presents to [facility] today for carpal tunnel/cubital tunnel syndrome and orthotics.  Patient continues to experience paresthesias into her hands bilaterally typically worse at night and with a elbow flexed hand positioning. She has not had an opportunity to use night splints yet and reluctant to take medications without a clear diagnosis. In the interim she's had a cervical MRI which was largely unremarkable as well as a nerve conduction study that was significant only for right-sided mild carpal tunnel syndrome.  Her symptoms are quite bothersome at times and are interfering with her ability to sleep normally and exercise normally.  Additionally patient is requesting orthotics. She has foot pain bilaterally and was seen by a podiatrist. She is recommended to have orthotics made.   No past medical history on file. Past Surgical History:  Procedure Laterality Date  . SINUS SURGERY WITH INSTATRAK  02/27/2017  . tummy tuck  2014   Social History  Substance Use Topics  . Smoking status: Never Smoker  . Smokeless tobacco: Never Used  . Alcohol use Not on file     ROS:  As above   Medications: Current Outpatient Prescriptions  Medication Sig Dispense Refill  . gabapentin (NEURONTIN) 300 MG capsule One tab PO qHS for a week, then BID for a week, then TID. May double weekly to a max of 3,600mg /day 180 capsule 3   No current facility-administered medications for this visit.    Allergies  Allergen Reactions  . No Known Allergies      Exam:  Wt 150 lb (68 kg)   BMI 27.44 kg/m  General: Well Developed, well nourished, and in no acute distress.  Neuro/Psych: Alert and oriented x3, extra-ocular muscles intact, able to move all 4 extremities, sensation grossly intact. Skin: Warm and dry, no rashes noted.  Respiratory: Not using accessory muscles, speaking in full sentences, trachea midline.    Cardiovascular: Pulses palpable, no extremity edema. Abdomen: Does not appear distended. MSK:  Feet unremarkable. Bilaterally nontender normal gait.  Orthotics made as noted below.   Patient was fitted for a : standard, cushioned, semi-rigid orthotic. The orthotic was heated and afterward the patient stood on the orthotic blank positioned on the orthotic stand. The patient was positioned in subtalar neutral position and 10 degrees of ankle dorsiflexion in a weight bearing stance. After completion of molding, a stable base was applied to the orthotic blank. The blank was ground to a stable position for weight bearing. Size: 8 Base: [employer] and safety inspector and Padding: None The patient ambulated these, and they were very comfortable.    Study Result   CLINICAL DATA:  Neck pain and stiffness. Bilateral arm pain and numbness.  EXAM: MRI CERVICAL SPINE WITHOUT CONTRAST  TECHNIQUE: Multiplanar, multisequence MR imaging of the cervical spine was performed. No intravenous contrast was administered.  COMPARISON:  None.  FINDINGS: Alignment: Reversal of cervical lordosis, usually positional. No listhesis.  Vertebrae: No fracture, evidence of discitis, or aggressive bone lesion. A hemangioma is incidentally noted in the T2 body and right posterior elements.  Cord: Normal signal and morphology.  Posterior Fossa, vertebral arteries, paraspinal tissues: Negative  Disc levels:  C5-6 small right foraminal disc protrusion. The background disc is mildly narrowed and desiccated. Mild right foraminal narrowing. Widely patent spinal canal.  The other disc levels are negative for impingement or evidence degenerative change.  IMPRESSION: 1. C5-6 small right foraminal disc protrusion with mild foraminal stenosis.  2. Otherwise negative.   Electronically Signed   By: Monte Fantasia M.D.   On: 05/04/2017 14:35     STUDY DATE: 04/23/17 PATIENT NAME:  Cynthia Weber DOB: 07-20-1976 MRN: [id number]  ORDERING CLINICIAN: Lynne Leader, MD  TECHNOLOGIST: Oneita Jolly ELECTROMYOGRAPHER: Earlean Polka. Penumalli, MD  CLINICAL INFORMATION: 41 year old female with intermittent numbness and pain in bilateral upper extremities. Patient has left elbow pain. Evaluate for left cubital tunnel syndrome or other explanation of bilateral upper extremity symptoms.  FINDINGS: NERVE CONDUCTION STUDY: Bilateral median motor responses are normal. Bilateral ulnar motor responses are normal.  Right median sensory response has slightly prolonged peak latency and slightly decreased amplitude.  Left median and bilateral ulnar sensory responses are normal.  Right median to ulnar transcarpal comparison study shows prolonged peak latency difference.  Left median to ulnar transcarpal comparison study is normal.   NEEDLE ELECTROMYOGRAPHY:  Needle examination of left upper extremity deltoid, biceps, triceps, flexor carpi radialis, first dorsal interosseous and left cervical paraspinal muscles are normal.   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 - No electrodiagnostic evidence of LEFT cubital tunnel syndrome as queried. No evidence of neuropathy or cervical radiculopathy affecting the left upper extremity.      INTERPRETING PHYSICIAN:  Penni Bombard, MD Certified in Neurology, Neurophysiology and Neuroimaging  [facility] [address], [place], [place] [address] [phone]    No results found for this or any previous visit (from the past 48 hour(s)). Dg Foot Complete Right  Result Date: 05/08/2017 CLINICAL DATA:  Injury to the right foot, initial encounter. Right foot pain and swelling. EXAM: RIGHT FOOT COMPLETE - 3+ VIEW COMPARISON:  None. FINDINGS: Negative for fracture or dislocation. Alignment of  the right foot is normal. No focal soft tissue abnormality. IMPRESSION: No acute bone abnormality to the right foot. Electronically Signed   By: Markus Daft M.D.   On: 05/08/2017 09:07      Assessment and Plan: 41 y.o. female with  Paresthesias to hands bilaterally are likely combination of positional dependent cubital and carpal tunnel syndrome.  Plan to treat with wrist and elbow night splinting as well as continue physical therapy. Recommend use of gabapentin at bedtime. Recheck in 6 weeks if not better will proceed with injection potentially.    No orders of the defined types were placed in this encounter.  Meds ordered this encounter  Medications  . gabapentin (NEURONTIN) 300 MG capsule    Sig: One tab PO qHS for a week, then BID for a week, then TID. May double weekly to a max of 3,600mg /day    Dispense:  180 capsule    Refill:  3    Discussed warning signs or symptoms. Please see discharge instructions. Patient expresses understanding.    I spent 40 minutes with this patient independent of orthotic preparation, greater than 50% was face-to-face time counseling regarding risks and benefits of orthotics expected side effects in addition to what type of shoes or compatible with semirigid orthotics as well as expected lifetime of orthotics.

## 2017-05-11 NOTE — Patient Instructions (Signed)
Thank you for coming in today. Use the night splints.  Attend PT.  Fill and take the gabapentin as needed for pain at night.  Recheck with me in 6 weeks.  We will consider injection.

## 2017-05-13 ENCOUNTER — Ambulatory Visit (INDEPENDENT_AMBULATORY_CARE_PROVIDER_SITE_OTHER): Payer: BLUE CROSS/BLUE SHIELD | Admitting: Physical Therapy

## 2017-05-13 ENCOUNTER — Encounter: Payer: Self-pay | Admitting: Physical Therapy

## 2017-05-13 DIAGNOSIS — R29898 Other symptoms and signs involving the musculoskeletal system: Secondary | ICD-10-CM

## 2017-05-13 DIAGNOSIS — M6281 Muscle weakness (generalized): Secondary | ICD-10-CM | POA: Diagnosis not present

## 2017-05-13 NOTE — Therapy (Signed)
[place], [place], [address] Phone: [phone]   Fax:  [phone]  Physical Therapy Treatment  Patient Details  Name: Cynthia Weber MRN: [id number] Date of Birth: 09-Aug-1975 Referring Provider: Dr Steva Colder  Encounter Date: 05/13/2017      PT End of Session - 05/13/17 0803    Visit Number 5   Number of Visits 12   Date for PT Re-Evaluation 06/10/17   Authorization Type 30 visits per year.    PT Start Time 0803   PT Stop Time 0901   PT Time Calculation (min) 58 min   Activity Tolerance Patient tolerated treatment well      History reviewed. No pertinent past medical history.  Past Surgical History:  Procedure Laterality Date  . SINUS SURGERY WITH INSTATRAK  02/27/2017  . tummy tuck  2014    There were no vitals filed for this visit.      Subjective Assessment - 05/13/17 0804    Subjective Pt reports she had a headache after last traction session, also got her period so she's not sure. Is waking less often with arm numbness the last couple of nights. She hasn't picked up arm splints yet and is not sure how she will be able to sleep in them .    Patient Stated Goals stop hand numbess, write without difficulty   Currently in Pain? Yes   Pain Score 4    Pain Location Elbow   Pain Orientation Left;Right   Pain Descriptors / Indicators Tightness   Pain Type Chronic pain                         [facility] Adult PT Treatment/Exercise - 05/13/17 0001      Neck Exercises: Machines for Strengthening   UBE (Upper Arm Bike) L2x4' alt FWD/BWD     Neck Exercises: Supine   Other Supine Exercise 3x10 hooklying pull downs holding 4# wts, TA engaged     Neck Exercises: Prone   Other Prone Exercise leaning on ball, wide rows with 4#      Modalities   Modalities Electrical Stimulation;Moist Heat     Moist Heat Therapy   Number Minutes Moist Heat 15 Minutes   Moist Heat  Location --  thoracic     Electrical Stimulation   Electrical Stimulation Location thoracic   Electrical Stimulation Action IFC   Electrical Stimulation Parameters to tolerance   Electrical Stimulation Goals Tone     Traction   Type of Traction --  held today due to HA last session     Manual Therapy   Manual Therapy Joint mobilization   Joint Mobilization grade III thoracic mobs, CPA & bilat UPA  pt with increased mobility in spine     Neck Exercises: Stretches   Upper Trapezius Stretch 30 seconds   Other Neck Stretches scalene stretch                     PT Long Term Goals - 05/11/17 1430      PT LONG TERM GOAL #1   Title I with HEP for nerve glides ( 06/10/17)    Status Achieved     PT LONG TERM GOAL #2   Title improve FOTO =/< 38% limited ( 06/10/17)    Status On-going     PT LONG TERM GOAL #3   Title demo Rt grip strength =/> Lt and no reports of feeling  like she will drop something ( 06/10/17)    Status Achieved     PT LONG TERM GOAL #4   Title report =/> 75% reduction in reports of numbness and tingling in her arms ( 06/10/17)    Status On-going     PT LONG TERM GOAL #5   Title report ability to sleep per her previous level and not wake up due to numbness/tingling in her arms ( 06/10/17)      PT LONG TERM GOAL #6   Title demo strong contraction of multifidi and TA to stabilize core ( 06/10/17)    Status On-going               Plan - 05/13/17 0848    Clinical Impression Statement Glen had increased mobility in her thoracic spine with mobilizations.  she is still getting numbness/tingling in both arms with certain positions.    Rehab Potential Good   PT Frequency 2x / week   PT Duration 6 weeks   PT Treatment/Interventions Moist Heat;Traction;Ultrasound;Therapeutic exercise;Dry needling;Manual techniques;Neuromuscular re-education;Cryotherapy;Electrical Stimulation;Patient/family education;Passive range of motion   PT Next Visit  Plan thoracic mobs, cont traction/heat/stim      Patient will benefit from skilled therapeutic intervention in order to improve the following deficits and impairments:  Decreased range of motion, Impaired UE functional use, Pain, Decreased strength  Visit Diagnosis: Muscle weakness (generalized)  Other symptoms and signs involving the musculoskeletal system     Problem List Patient Active Problem List   Diagnosis Date Noted  . Paresthesia of upper extremity 05/11/2017  . Right foot injury 05/08/2017  . Menorrhagia with regular cycle 04/07/2017  . Family history of MI (myocardial infarction) 04/07/2017  . Family history of thyroid disease 04/07/2017  . 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

## 2017-05-14 ENCOUNTER — Encounter: Payer: BLUE CROSS/BLUE SHIELD | Admitting: Physical Therapy

## 2017-05-20 ENCOUNTER — Ambulatory Visit (INDEPENDENT_AMBULATORY_CARE_PROVIDER_SITE_OTHER): Payer: BLUE CROSS/BLUE SHIELD | Admitting: Physical Therapy

## 2017-05-20 ENCOUNTER — Encounter: Payer: Self-pay | Admitting: Physical Therapy

## 2017-05-20 DIAGNOSIS — R29898 Other symptoms and signs involving the musculoskeletal system: Secondary | ICD-10-CM | POA: Diagnosis not present

## 2017-05-20 DIAGNOSIS — M6281 Muscle weakness (generalized): Secondary | ICD-10-CM | POA: Diagnosis not present

## 2017-05-20 NOTE — Patient Instructions (Signed)

## 2017-05-20 NOTE — Therapy (Signed)
[facility] [place], [place], [address] Phone: [phone]   Fax:  [phone]  Physical Therapy Treatment  Patient Details  Name: Cynthia Weber MRN: [id number] Date of Birth: 25-Oct-1975 Referring Provider: Dr Steva Colder  Encounter Date: 05/20/2017      PT End of Session - 05/20/17 0803    Visit Number 6   Number of Visits 12   Date for PT Re-Evaluation 06/10/17   Authorization Type 30 visits per year.    PT Start Time 0804   PT Stop Time 0915   PT Time Calculation (min) 71 min   Activity Tolerance Patient tolerated treatment well      History reviewed. No pertinent past medical history.  Past Surgical History:  Procedure Laterality Date  . SINUS SURGERY WITH INSTATRAK  02/27/2017  . tummy tuck  2014    There were no vitals filed for this visit.      Subjective Assessment - 05/20/17 0804    Subjective Pt reports she is getting frustrated as her numbness seems to be getting more frequent. She did feel like the spinal mobs helped however it didn't last.    Diagnostic tests nerve conduction study Rt carpal tunnel very minimal.   MRI on Monday   Patient Stated Goals stop hand numbess, write without difficulty   Currently in Pain? Yes   Pain Score 4    Pain Orientation Right;Left   Pain Descriptors / Indicators Dull;Aching;Sore;Tightness   Pain Type Chronic pain   Pain Onset More than a month ago   Pain Frequency Intermittent   Aggravating Factors  increases with activity   Pain Relieving Factors repositioning            [facility] PT Assessment - 05/20/17 0001      Assessment   Medical Diagnosis cubital tunnel syndrome Rt, cervical radiculopathy     AROM   Cervical Flexion 30  38 after DN   Cervical - Right Rotation 52  62 after DN   Cervical - Left Rotation 52  62 after DN     Special Tests    Special Tests Thoracic Outlet Syndrome   Thoracic Outlet Syndrome  Adson Test;Arms  overhead     Adson Test   Findings Negative   Side  --  bilat     Arms overhead    Findings Negative   Side --  bilat                     [facility] Adult PT Treatment/Exercise - 05/20/17 0001      Neck Exercises: Machines for Strengthening   UBE (Upper Arm Bike) L3x 3' alt FWD/BWD     Modalities   Modalities Electrical Stimulation;Moist Heat     Moist Heat Therapy   Number Minutes Moist Heat 20 Minutes   Moist Heat Location Cervical  and upper shoulders     Electrical Stimulation   Electrical Stimulation Location cervical and upper shoulders   Electrical Stimulation Action burst   Electrical Stimulation Parameters to tolerance   Electrical Stimulation Goals Pain;Tone     Manual Therapy   Manual Therapy Joint mobilization;Soft tissue mobilization   Joint Mobilization grade III CPA and bilat UPA mobs cervical spine and upper thoracic, increased mobility in the thoracic   Soft tissue mobilization bilat upper traps, levators and pericervical.      Neck Exercises: Stretches   Other Neck Stretches doorway stretch low and mid - increases symptoms in  bilat UE's           Trigger Point Dry Needling - 05/20/17 0102    Consent Given? Yes   Education Handout Provided Yes   Muscles Treated Upper Body Upper trapezius;Levator scapulae;Longissimus                   PT Long Term Goals - 05/11/17 1430      PT LONG TERM GOAL #1   Title I with HEP for nerve glides ( 06/10/17)    Status Achieved     PT LONG TERM GOAL #2   Title improve FOTO =/< 38% limited ( 06/10/17)    Status On-going     PT LONG TERM GOAL #3   Title demo Rt grip strength =/> Lt and no reports of feeling like she will drop something ( 06/10/17)    Status Achieved     PT LONG TERM GOAL #4   Title report =/> 75% reduction in reports of numbness and tingling in her arms ( 06/10/17)    Status On-going     PT LONG TERM GOAL #5   Title report ability to sleep per her previous level and  not wake up due to numbness/tingling in her arms ( 06/10/17)      PT LONG TERM GOAL #6   Title demo strong contraction of multifidi and TA to stabilize core ( 06/10/17)    Status On-going               Plan - 05/20/17 0902    Clinical Impression Statement Cynthia Weber has had limited improvements over the long term with PT, the relief has been very short lasting between visits.  No goals met.  We worked more manual today and added in dry needling,  She was very reactive, Rt > Lt.  She did have increased cervical ROM after treatment however was very sore.  Stim and heat settled it down some. Negative assessment for thoracic outlet.    Rehab Potential Good   PT Frequency 2x / week   PT Duration 6 weeks   PT Treatment/Interventions Moist Heat;Traction;Ultrasound;Therapeutic exercise;Dry needling;Manual techniques;Neuromuscular re-education;Cryotherapy;Electrical Stimulation;Patient/family education;Passive range of motion   PT Next Visit Plan assess response to DN, if no improvement place on hold while she gets another opinion.    Consulted and Agree with Plan of Care Patient      Patient will benefit from skilled therapeutic intervention in order to improve the following deficits and impairments:  Decreased range of motion, Impaired UE functional use, Pain, Decreased strength  Visit Diagnosis: Muscle weakness (generalized)  Other symptoms and signs involving the musculoskeletal system     Problem List Patient Active Problem List   Diagnosis Date Noted  . Paresthesia of upper extremity 05/11/2017  . Right foot injury 05/08/2017  . Menorrhagia with regular cycle 04/07/2017  . Family history of MI (myocardial infarction) 04/07/2017  . Family history of thyroid disease 04/07/2017  . 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

## 2017-05-22 ENCOUNTER — Encounter: Payer: Self-pay | Admitting: Physical Therapy

## 2017-05-22 ENCOUNTER — Ambulatory Visit (INDEPENDENT_AMBULATORY_CARE_PROVIDER_SITE_OTHER): Payer: BLUE CROSS/BLUE SHIELD | Admitting: Physical Therapy

## 2017-05-22 DIAGNOSIS — M6281 Muscle weakness (generalized): Secondary | ICD-10-CM

## 2017-05-22 DIAGNOSIS — R29898 Other symptoms and signs involving the musculoskeletal system: Secondary | ICD-10-CM | POA: Diagnosis not present

## 2017-05-22 NOTE — Therapy (Signed)
[place], [place], [address] Phone: [phone]   Fax:  [phone]  Physical Therapy Treatment  Patient Details  Name: Cynthia Weber MRN: [id number] Date of Birth: 28-Oct-1975 Referring Provider: Dr Steva Colder  Encounter Date: 05/22/2017      PT End of Session - 05/22/17 0756    Visit Number 7   Number of Visits 12   Date for PT Re-Evaluation 06/10/17   Authorization Type 30 visits per year.    PT Start Time 0756   PT Stop Time 0902   PT Time Calculation (min) 66 min   Activity Tolerance Patient tolerated treatment well      History reviewed. No pertinent past medical history.  Past Surgical History:  Procedure Laterality Date  . SINUS SURGERY WITH INSTATRAK  02/27/2017  . tummy tuck  2014    There were no vitals filed for this visit.      Subjective Assessment - 05/22/17 0756    Subjective Pt reports she was very sore after the DN and is still sore.  Rt side was more sore and feels tight still.    Patient Stated Goals stop hand numbess, write without difficulty   Currently in Pain? Yes   Pain Score 5    Pain Location Neck   Pain Orientation Right   Pain Descriptors / Indicators Aching   Pain Type Chronic pain   Pain Onset More than a month ago   Pain Frequency Intermittent   Aggravating Factors  increases with activity   Pain Relieving Factors repositioning.                          [facility] Adult PT Treatment/Exercise - 05/22/17 0001      Modalities   Modalities Electrical Stimulation;Moist Heat     Moist Heat Therapy   Number Minutes Moist Heat 20 Minutes   Moist Heat Location Cervical     Electrical Stimulation   Electrical Stimulation Location cervical and upper shoulders   Electrical Stimulation Action ICF   Electrical Stimulation Parameters to tolerance   Electrical Stimulation Goals Pain;Tone     Manual Therapy   Soft tissue mobilization STM  to bilat upper traps and cervical paraspinals. Less tightness palpable as compared to last visit.           Trigger Point Dry Needling - 05/22/17 0801    Consent Given? Yes   Education Handout Provided No   Muscles Treated Upper Body Upper trapezius;Levator scapulae   Upper Trapezius Response Twitch reponse elicited;Palpable increased muscle length  bilat with stim in prone, also hit in supine   Levator Scapulae Response Twitch response elicited;Palpable increased muscle length  bilat                   PT Long Term Goals - 05/11/17 1430      PT LONG TERM GOAL #1   Title I with HEP for nerve glides ( 06/10/17)    Status Achieved     PT LONG TERM GOAL #2   Title improve FOTO =/< 38% limited ( 06/10/17)    Status On-going     PT LONG TERM GOAL #3   Title demo Rt grip strength =/> Lt and no reports of feeling like she will drop something ( 06/10/17)    Status Achieved     PT LONG TERM GOAL #4   Title report =/> 75% reduction in reports of  numbness and tingling in her arms ( 06/10/17)    Status On-going     PT LONG TERM GOAL #5   Title report ability to sleep per her previous level and not wake up due to numbness/tingling in her arms ( 06/10/17)      PT LONG TERM GOAL #6   Title demo strong contraction of multifidi and TA to stabilize core ( 06/10/17)    Status On-going               Plan - 05/22/17 0953    Clinical Impression Statement Cynthia Weber was very sore after her last treatment, her muscles are more plyable today.  She is still having symptoms into her UE's.  We will work on loosening up her cervical muscles, then try traction again to see if we can decompress her disc.    Rehab Potential Good   PT Frequency 2x / week   PT Duration 6 weeks   PT Treatment/Interventions Moist Heat;Traction;Ultrasound;Therapeutic exercise;Dry needling;Manual techniques;Neuromuscular re-education;Cryotherapy;Electrical Stimulation;Patient/family education;Passive range  of motion   PT Next Visit Plan assess response to DN, try cervical traction with slightly lower pull   Consulted and Agree with Plan of Care Patient      Patient will benefit from skilled therapeutic intervention in order to improve the following deficits and impairments:  Decreased range of motion, Impaired UE functional use, Pain, Decreased strength  Visit Diagnosis: Muscle weakness (generalized)  Other symptoms and signs involving the musculoskeletal system     Problem List Patient Active Problem List   Diagnosis Date Noted  . Paresthesia of upper extremity 05/11/2017  . Right foot injury 05/08/2017  . Menorrhagia with regular cycle 04/07/2017  . Family history of MI (myocardial infarction) 04/07/2017  . Family history of thyroid disease 04/07/2017  . 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

## 2017-05-25 ENCOUNTER — Ambulatory Visit (INDEPENDENT_AMBULATORY_CARE_PROVIDER_SITE_OTHER): Payer: BLUE CROSS/BLUE SHIELD | Admitting: Family Medicine

## 2017-05-25 ENCOUNTER — Encounter: Payer: Self-pay | Admitting: Physical Therapy

## 2017-05-25 ENCOUNTER — Encounter: Payer: Self-pay | Admitting: Family Medicine

## 2017-05-25 ENCOUNTER — Ambulatory Visit (INDEPENDENT_AMBULATORY_CARE_PROVIDER_SITE_OTHER): Payer: BLUE CROSS/BLUE SHIELD | Admitting: Physical Therapy

## 2017-05-25 VITALS — Wt 149.0 lb

## 2017-05-25 DIAGNOSIS — R29898 Other symptoms and signs involving the musculoskeletal system: Secondary | ICD-10-CM

## 2017-05-25 DIAGNOSIS — M6281 Muscle weakness (generalized): Secondary | ICD-10-CM | POA: Diagnosis not present

## 2017-05-25 DIAGNOSIS — M7741 Metatarsalgia, right foot: Secondary | ICD-10-CM | POA: Diagnosis not present

## 2017-05-25 DIAGNOSIS — R202 Paresthesia of skin: Secondary | ICD-10-CM

## 2017-05-25 NOTE — Therapy (Signed)
[place], [place], [address] Phone: [phone]   Fax:  [phone]  Physical Therapy Treatment  Patient Details  Name: Cynthia Weber MRN: [id number] Date of Birth: [birth year] Referring Provider: Dr Steva Colder  Encounter Date: 05/25/2017      PT End of Session - 05/25/17 1552    Visit Number 8   Number of Visits 12   Date for PT Re-Evaluation 06/10/17   Authorization Type 30 visits per year.    PT Start Time 2229   PT Stop Time 1658   PT Time Calculation (min) 64 min   Activity Tolerance Patient tolerated treatment well      History reviewed. No pertinent past medical history.  Past Surgical History:  Procedure Laterality Date  . SINUS SURGERY WITH INSTATRAK  02/27/2017  . tummy tuck  2014    There were no vitals filed for this visit.      Subjective Assessment - 05/25/17 1558    Subjective Pt saw Dr Georgina Snell, he will try and get a referral to neuology however not sure what else they may do.  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 Encouraging her to use steroid dose pack and then possibly injections. She thinks her mobility in her neck after DN.    Patient Stated Goals stop hand numbess, write without difficulty   Currently in Pain? Yes   Pain Score 4    Pain Location Neck   Pain Orientation Right;Left   Pain Descriptors / Indicators Aching   Pain Type Chronic pain   Pain Onset More than a month ago   Pain Frequency Intermittent   Aggravating Factors  positional   Pain Relieving Factors repositioning.            [facility] PT Assessment - 05/25/17 0001      AROM   Cervical Flexion 42   Cervical Extension 42   Cervical - Right Side Bend 25   Cervical - Left Side Bend 47   Cervical - Right Rotation 63   Cervical - Left Rotation 65     Special Tests    Special Tests --  (-) carpal tunnel  special tests.                      [facility] Adult PT Treatment/Exercise - 05/25/17 0001      Neck Exercises: Machines for Strengthening   UBE (Upper Arm Bike) L3x 3' alt FWD/BWD     Modalities   Modalities Electrical Stimulation;Moist Heat     Moist Heat Therapy   Number Minutes Moist Heat 15 Minutes   Moist Heat Location --  bilat forearms     Electrical Stimulation   Electrical Stimulation Location bilat forearms   Electrical Stimulation Action premod   Electrical Stimulation Parameters to tolerance    Electrical Stimulation Goals Pain;Tone     Traction   Type of Traction Cervical   Min (lbs) 8   Max (lbs) 15   Hold Time 60   Rest Time 20   Time 19     Manual Therapy   Soft tissue mobilization STM to bilat forearms, extensors and flexors and Lt distal deltoid          Trigger Point Dry Needling - 05/25/17 1615    Consent Given? Yes   Education Handout Provided No   Muscles Treated Upper Body --  Lt deltoid, bilat forearms bilat, flexors and extensors.                    PT Long Term Goals - 05/11/17 1430      PT LONG TERM GOAL #1   Title I with HEP for nerve glides ( 06/10/17)    Status Achieved     PT LONG TERM GOAL #2   Title improve FOTO =/< 38% limited ( 06/10/17)    Status On-going     PT LONG TERM GOAL #3   Title demo Rt grip strength =/> Lt and no reports of feeling like she will drop something ( 06/10/17)    Status Achieved     PT LONG TERM GOAL #4   Title report =/> 75% reduction in reports of numbness and tingling in her arms ( 06/10/17)    Status On-going     PT LONG TERM GOAL #5   Title report ability to sleep per her previous level and not wake up due to numbness/tingling in her arms ( 06/10/17)      PT LONG TERM GOAL #6   Title demo strong contraction of multifidi and TA to stabilize core ( 06/10/17)    Status On-going               Plan - 05/25/17 1645    Clinical Impression Statement Pt presents  with very complicating symptoms.  We added in cervical traction again with less pull to see if this will off load the cervical disc and ease her upper arm symptoms.  Also performed manual work an dDN to bilat forearms.  She had multiple areas of tightness and good response to this, at least immediately after treatment.  She does have improved cervical range of motion however numbness continues.    Rehab Potential Good   PT Frequency 2x / week   PT Duration 6 weeks   PT Treatment/Interventions Moist Heat;Traction;Ultrasound;Therapeutic exercise;Dry needling;Manual techniques;Neuromuscular re-education;Cryotherapy;Electrical Stimulation;Patient/family education;Passive range of motion   PT Next Visit Plan cont cervical traction, return to 16# if not head ache after last tx,  manual work to bilat forearms and upper shoulders/neck, possible thermal [place] to forearms.    Consulted and Agree with Plan of Care Patient      Patient will benefit from skilled therapeutic intervention in order to improve the following deficits and impairments:  Decreased range of motion, Impaired UE functional use, Pain, Decreased strength  Visit Diagnosis: Other symptoms and signs involving the musculoskeletal system  Muscle weakness (generalized)     Problem List Patient Active Problem List   Diagnosis Date Noted  . Paresthesia of upper extremity 05/11/2017  . Right foot injury 05/08/2017  . Menorrhagia with regular cycle 04/07/2017  . Family history of MI (myocardial infarction) 04/07/2017  . Family history of thyroid disease 04/07/2017  . 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

## 2017-05-25 NOTE — Patient Instructions (Addendum)
Thank you for coming in today. Use metatarsal pads in the insoles.  You can get them from [place].  You can also get over the counter insoles or produces as needed.  Look for metatarsal support.   :Let me know if you want to try a steroid course.   We can do injections at any time.

## 2017-05-26 DIAGNOSIS — M7740 Metatarsalgia, unspecified foot: Secondary | ICD-10-CM | POA: Insufficient documentation

## 2017-05-26 NOTE — Progress Notes (Signed)
Cynthia Weber is a 41 y.o. female who presents to [facility] today for metatarsalgia and upper extremity paresthesias.  Patient was seen October 15 for orthotics.  She notes the orthotics have not really improved the pain she has been having in her foot.  She notes pain at the right second metatarsal head.  Her is better with rest and worse with ambulation.  Additionally she notes persistent paresthesias to her hands bilaterally.  This is worse at night and when she has her hands over her head and when she is using a cell phone.  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  She is reluctant to take prednisone and gabapentin.   No past medical history on file. Past Surgical History:  Procedure Laterality Date  . SINUS SURGERY WITH INSTATRAK  02/27/2017  . tummy tuck  2014   Social History  Substance Use Topics  . Smoking status: Never Smoker  . Smokeless tobacco: Never Used  . Alcohol use Not on file     ROS:  As above   Medications: Current Outpatient Prescriptions  Medication Sig Dispense Refill  . gabapentin (NEURONTIN) 300 MG capsule One tab PO qHS for a week, then BID for a week, then TID. May double weekly to a max of 3,600mg /day 180 capsule 3   No current facility-administered medications for this visit.    Allergies  Allergen Reactions  . No Known Allergies      Exam:  Wt 149 lb (67.6 kg)   BMI 27.25 kg/m  General: Well Developed, well nourished, and in no acute distress.  Neuro/Psych: Alert and oriented x3, extra-ocular muscles intact, able to move all 4 extremities, sensation grossly intact. Skin: Warm and dry, no rashes noted.  Respiratory: Not using accessory muscles, speaking in full sentences, trachea midline.  Cardiovascular: Pulses palpable, no extremity edema. Abdomen: Does not appear  distended. MSK:  Right foot callus formation plantar second third and fourth metatarsal heads.  Tender to palpation second metatarsal head.  Otherwise normal.  Pulses capillary refill and sensation intact  Left arm positive Tinel's at cubital tunnel negative carpal tunnel.  Positive Phalen's test Right arm positive Tinel's cubital tunnel negative carpal tunnel positive Phalen's test.     No results found for this or any previous visit (from the past 48 hour(s)). No results found.    Assessment and Plan: 41 y.o. female with  Foot pain patient has metatarsalgia.  I have added metatarsal pads to her orthotics and will see how she does over the next few weeks.  She clearly also has paresthesias to her arms bilaterally.  Some of this could be cervical radicular and some certainly could be cubital tunnel and carpal tunnel.  I do not think the diagnostic tests that we have done has sufficiently shown the degree of symptoms that she is experiencing.  We had a long conversation about next steps.  I think a second opinion is reasonable when we talked about that.  I also discussed a trial of oral prednisone versus trial of nerve hydrodissection under ultrasound guidance at the Cubital Tunnel and carpal tunnel.  And we also talked about epidural steroid injection at the C-spine.  She is not sure what she would like to do and will think about it and get back to me.    No orders of the defined types were placed  in this encounter.  No orders of the defined types were placed in this encounter.   Discussed warning signs or symptoms. Please see discharge instructions. Patient expresses understanding.  I spent 25 minutes with this patient, greater than 50% was face-to-face time counseling regarding treatment plan.

## 2017-05-27 ENCOUNTER — Encounter: Payer: Self-pay | Admitting: Family Medicine

## 2017-05-27 DIAGNOSIS — R202 Paresthesia of skin: Secondary | ICD-10-CM

## 2017-05-27 DIAGNOSIS — M791 Myalgia, unspecified site: Secondary | ICD-10-CM

## 2017-05-29 ENCOUNTER — Ambulatory Visit (INDEPENDENT_AMBULATORY_CARE_PROVIDER_SITE_OTHER): Payer: BLUE CROSS/BLUE SHIELD | Admitting: Physical Therapy

## 2017-05-29 DIAGNOSIS — M6281 Muscle weakness (generalized): Secondary | ICD-10-CM

## 2017-05-29 DIAGNOSIS — R29898 Other symptoms and signs involving the musculoskeletal system: Secondary | ICD-10-CM

## 2017-05-29 NOTE — Therapy (Signed)
[place], [place], [address] Phone: [phone]   Fax:  [phone]  Physical Therapy Treatment  Patient Details  Name: Cynthia Weber MRN: [id number] Date of Birth: [birth year] Referring Provider: Dr. Georgina Snell  Encounter Date: 05/29/2017      PT End of Session - 05/29/17 0955    Visit Number 9   Number of Visits 12   Date for PT Re-Evaluation 06/10/17   Authorization Type 30 visits per year.    PT Start Time 0803   PT Stop Time 0905   PT Time Calculation (min) 62 min   Activity Tolerance Patient tolerated treatment well   Behavior During Therapy [facility] for tasks assessed/performed      No past medical history on file.  Past Surgical History:  Procedure Laterality Date  . SINUS SURGERY WITH INSTATRAK  02/27/2017  . tummy tuck  2014    There were no vitals filed for this visit.      Subjective Assessment - 05/29/17 0806    Subjective Cynthia Weber reports that she was sore and bruised from DN last visit.  She hasn't woken up as frequently from numbness over last week.  She is curious if she has a vitamin deficiency causing the numbness.    Patient Stated Goals stop hand numbess, write without difficulty   Currently in Pain? Yes   Pain Score 2    Pain Location Arm   Pain Orientation Right;Left   Pain Descriptors / Indicators Aching   Pain Radiating Towards shoulders down to wrists.   Aggravating Factors  positional, certain activities involving use of arms   Pain Relieving Factors repositioning            [facility] PT Assessment - 05/29/17 0001      Assessment   Medical Diagnosis cubital tunnel syndrome Rt, cervical radiculopathy   Referring Provider Dr. Georgina Snell   Onset Date/Surgical Date 10/28/16   Hand Dominance Right           [facility] Adult PT Treatment/Exercise - 05/29/17 0001      Neck Exercises: Machines for Strengthening   UBE (Upper Arm Bike) L1: 2 min forward, 1 min  backward.   pt reported increased numbness in fingers with backward     Neck Exercises: Supine   Other Supine Exercise snow angels x 10 to tolerance      Modalities   Modalities Electrical Stimulation;Moist Heat;Ultrasound     Moist Heat Therapy   Number Minutes Moist Heat 19 Minutes   Moist Heat Location Cervical  Lt arm; during traction      Electrical Stimulation   Electrical Stimulation Location Lt post deltoid, Lt rhomboid, Lt prox tricep  pt in prone position with towel roll at shoulder   Electrical Stimulation Action combo [place]    Electrical Stimulation Parameters to tolerance    Electrical Stimulation Goals Tone;Pain     Ultrasound   Ultrasound Location see estim   Ultrasound Parameters combo [place]: 100%, 1.3 w/cm2, 8 min    Ultrasound Goals Pain;Other (Comment)  tone     Traction   Type of Traction Cervical   Min (lbs) 8   Max (lbs) 15   Hold Time 60   Rest Time 20   Time 19     Manual Therapy   Manual Therapy Taping;Myofascial release;Soft tissue mobilization   Manual therapy comments pt supine for Lt forearm work, prone for post Lt shoulder work.  Regular rock tape applied to Lt wrist  flexors up to lateral elbow, I strip following Lt deep back arm line from elbow to rhomboid.  Tape applied with 10% stretch to decrease pain, inhibit muscle.    Soft tissue mobilization Edge tool assistance to Lt tricep, post deltoid, infraspinatus;  STM to Lt wrist flexors and Lt levator.    Myofascial Release MFR to Lt superficial/deep arm line      Neck Exercises: Stretches   Upper Trapezius Stretch 2 reps;20 seconds   Other Neck Stretches low and mid level doorway stretch bilat x 30 x 3 reps, unilateral high doorway stretch x 30 each arm. Wrist flexor stretch with palms on table x 30 sec x 3 reps                 PT Education - 05/29/17 0957    Education provided Yes   Education Details Rock tape info,rationale and safe removal technique    Person(s) Educated Patient    Methods Explanation   Comprehension Verbalized understanding             PT Long Term Goals - 05/11/17 1430      PT LONG TERM GOAL #1   Title I with HEP for nerve glides ( 06/10/17)    Status Achieved     PT LONG TERM GOAL #2   Title improve FOTO =/< 38% limited ( 06/10/17)    Status On-going     PT LONG TERM GOAL #3   Title demo Rt grip strength =/> Lt and no reports of feeling like she will drop something ( 06/10/17)    Status Achieved     PT LONG TERM GOAL #4   Title report =/> 75% reduction in reports of numbness and tingling in her arms ( 06/10/17)    Status On-going     PT LONG TERM GOAL #5   Title report ability to sleep per her previous level and not wake up due to numbness/tingling in her arms ( 06/10/17)      PT LONG TERM GOAL #6   Title demo strong contraction of multifidi and TA to stabilize core ( 06/10/17)    Status On-going               Plan - 05/29/17 8250    Clinical Impression Statement Pt had increased numbness and tingling when pulling backwards on UBE; resolved when pushing pedals forward.  She had positive response to DN in forearms; able to tolerate wrist flexor stretch with greater ease. Pt tolerated 15# pull on cervical traction well, no increased headache or symptoms. Pt had resolution of symptoms in Lt arm following todays treatment.  Pt making gradual gains towards goals   Rehab Potential Good   PT Frequency 2x / week   PT Duration 6 weeks   PT Treatment/Interventions Moist Heat;Traction;Ultrasound;Therapeutic exercise;Dry needling;Manual techniques;Neuromuscular re-education;Cryotherapy;Electrical Stimulation;Patient/family education;Passive range of motion   PT Next Visit Plan assess response to [employer] tape and combo [place].  cont manual therapy and traction.     Consulted and Agree with Plan of Care Patient      Patient will benefit from skilled therapeutic intervention in order to improve the following deficits and impairments:   Decreased range of motion, Impaired UE functional use, Pain, Decreased strength  Visit Diagnosis: Other symptoms and signs involving the musculoskeletal system  Muscle weakness (generalized)     Problem List Patient Active Problem List   Diagnosis Date Noted  . Metatarsalgia 05/26/2017  . Paresthesia of upper extremity 05/11/2017  .  Menorrhagia with regular cycle 04/07/2017  . Family history of MI (myocardial infarction) 04/07/2017  . Family history of thyroid disease 04/07/2017  . 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

## 2017-06-02 ENCOUNTER — Ambulatory Visit (INDEPENDENT_AMBULATORY_CARE_PROVIDER_SITE_OTHER): Payer: BLUE CROSS/BLUE SHIELD | Admitting: Physical Therapy

## 2017-06-02 ENCOUNTER — Encounter: Payer: Self-pay | Admitting: Family Medicine

## 2017-06-02 DIAGNOSIS — R29898 Other symptoms and signs involving the musculoskeletal system: Secondary | ICD-10-CM

## 2017-06-02 DIAGNOSIS — R790 Abnormal level of blood mineral: Secondary | ICD-10-CM | POA: Insufficient documentation

## 2017-06-02 DIAGNOSIS — M6281 Muscle weakness (generalized): Secondary | ICD-10-CM

## 2017-06-02 DIAGNOSIS — E559 Vitamin D deficiency, unspecified: Secondary | ICD-10-CM | POA: Insufficient documentation

## 2017-06-02 LAB — CBC WITH DIFFERENTIAL/PLATELET
BASOS PCT: 0.7 %
Basophils Absolute: 43 cells/uL (ref 0–200)
EOS ABS: 79 {cells}/uL (ref 15–500)
Eosinophils Relative: 1.3 %
HEMATOCRIT: 37.5 % (ref 35.0–45.0)
HEMOGLOBIN: 12.5 g/dL (ref 11.7–15.5)
LYMPHS ABS: 1806 {cells}/uL (ref 850–3900)
MCH: 29 pg (ref 27.0–33.0)
MCHC: 33.3 g/dL (ref 32.0–36.0)
MCV: 87 fL (ref 80.0–100.0)
MPV: 12 fL (ref 7.5–12.5)
Monocytes Relative: 13.7 %
NEUTROS ABS: 3337 {cells}/uL (ref 1500–7800)
Neutrophils Relative %: 54.7 %
Platelets: 260 10*3/uL (ref 140–400)
RBC: 4.31 10*6/uL (ref 3.80–5.10)
RDW: 12.9 % (ref 11.0–15.0)
TOTAL LYMPHOCYTE: 29.6 %
WBC: 6.1 10*3/uL (ref 3.8–10.8)
WBCMIX: 836 {cells}/uL (ref 200–950)

## 2017-06-02 LAB — MAGNESIUM: Magnesium: 1.9 mg/dL (ref 1.5–2.5)

## 2017-06-02 LAB — VITAMIN B12: Vitamin B-12: 338 pg/mL (ref 200–1100)

## 2017-06-02 LAB — ANA: ANA: NEGATIVE

## 2017-06-02 LAB — FERRITIN: Ferritin: 9 ng/mL — ABNORMAL LOW (ref 10–154)

## 2017-06-02 LAB — VITAMIN D 25 HYDROXY (VIT D DEFICIENCY, FRACTURES): Vit D, 25-Hydroxy: 18 ng/mL — ABNORMAL LOW (ref 30–100)

## 2017-06-02 LAB — C-REACTIVE PROTEIN: CRP: 1.3 mg/L (ref <8.0)

## 2017-06-02 LAB — CK: CK TOTAL: 79 U/L (ref 29–143)

## 2017-06-02 LAB — SEDIMENTATION RATE: SED RATE: 9 mm/h (ref 0–20)

## 2017-06-02 LAB — HEPATITIS C ANTIBODY
Hepatitis C Ab: NONREACTIVE
SIGNAL TO CUT-OFF: 0.01 (ref <1.00)

## 2017-06-02 LAB — FOLATE: FOLATE: 15.6 ng/mL

## 2017-06-02 LAB — CYCLIC CITRUL PEPTIDE ANTIBODY, IGG

## 2017-06-02 NOTE — Therapy (Signed)
[place], [place], [address] Phone: [phone]   Fax:  [phone]  Physical Therapy Treatment  Patient Details  Name: Cynthia Weber MRN: [id number] Date of Birth: 12/23/1975 Referring Provider: Dr. Georgina Snell    Encounter Date: 06/02/2017  PT End of Session - 06/02/17 0808    Visit Number  10    Number of Visits  12    Date for PT Re-Evaluation  06/10/17    Authorization Type  30 visits per year.     PT Start Time  0806    PT Stop Time  0913    PT Time Calculation (min)  67 min    Activity Tolerance  Patient tolerated treatment well    Behavior During Therapy  [facility] for tasks assessed/performed       No past medical history on file.  Past Surgical History:  Procedure Laterality Date  . SINUS SURGERY WITH INSTATRAK  02/27/2017  . tummy tuck  2014    There were no vitals filed for this visit.  Subjective Assessment - 06/02/17 0808    Subjective  Pt had blood work and is low in [place] and Vitamin D.  MD gave recommendations for suppliments.  She has had less waking in night from numbness than before (3-4x vs 12+). Her LUE now feels better than RUE.    Currently in Pain?  Yes    Pain Score  3     Pain Location  Arm    Pain Orientation  Right;Left    Pain Radiating Towards  shoulders to wrists,     Aggravating Factors   positional, certain activities involving use of arms     Pain Relieving Factors  repositioning.          [facility] PT Assessment - 06/02/17 0001      Assessment   Medical Diagnosis  cubital tunnel syndrome Rt, cervical radiculopathy    Referring Provider  Dr. Georgina Snell     Onset Date/Surgical Date  10/28/16    Hand Dominance  Right      AROM   Cervical Flexion  50    Cervical Extension  45    Cervical - Right Side Bend  47    Cervical - Left Side Bend  42    Cervical - Right Rotation  57    Cervical - Left Rotation  62        [facility] Adult PT Treatment/Exercise -  06/02/17 0001      Neck Exercises: Standing   Other Standing Exercises  superficial arm line stretch (cross body stretch with wrist flex and head rotation to same side) x 20 sec x 3 reps each side.  Deep arm line stretch (tricep stretch holding towel behind back ) x 30 sec x 3 reps each arm.   Midlevel doorway stretch x 30 sec x 2 reps       Ultrasound   Ultrasound Goals  Pain;Other Combo [place], with estim to tolerance. 100%, 1.2 w/cm2, 8 min. Location:  Rt rhomboid, Rt infraspinatus, post delt, prox tricep.   Tone      Traction   Type of Traction  Cervical    Min (lbs)  8    Max (lbs)  15    Hold Time  60    Rest Time  20    Time  19      Manual Therapy   Manual therapy comments  pt supine for Rt forearm and bicep work,  prone for post Rt/Lt shoulder work.  Regular rock tape applied in I strip following Rt deep back arm line from elbow to rhomboid.  Tape applied with 10% stretch to decrease pain, inhibit muscle.     Soft tissue mobilization  Edge tool assistance to Lt and Rt tricep, post deltoid, infraspinatus, rhomboids;  STM to Rt wrist flexors.   Myofascial Release  MFR to Rt superficial/deep arm line       Additional Modalities:  MHP to upper thoracic and Rt/Lt tricep during traction (19 min) to decrease pain.      PT Long Term Goals - 05/11/17 1430      PT LONG TERM GOAL #1   Title  I with HEP for nerve glides ( 06/10/17)     Status  Achieved      PT LONG TERM GOAL #2   Title  improve FOTO =/< 38% limited ( 06/10/17)     Status  On-going      PT LONG TERM GOAL #3   Title  demo Rt grip strength =/> Lt and no reports of feeling like she will drop something ( 06/10/17)     Status  Achieved      PT LONG TERM GOAL #4   Title  report =/> 75% reduction in reports of numbness and tingling in her arms ( 06/10/17)     Status  On-going      PT LONG TERM GOAL #5   Title  report ability to sleep per her previous level and not wake up due to numbness/tingling in her arms (  06/10/17)       PT LONG TERM GOAL #6   Title  demo strong contraction of multifidi and TA to stabilize core ( 06/10/17)     Status  On-going            Plan - 06/02/17 2355    Clinical Impression Statement  Pt had positive response to last treatment including MFR, combo [place], cervical traction and tape to LUE.  Repeated similar treatment with focus to RUE.  Pt reporting less symptoms in night.  She reported reduction in symptoms in UE at end of session.   Pt making gradual progress towards established goals.     Rehab Potential  Good    PT Frequency  2x / week    PT Duration  6 weeks    PT Treatment/Interventions  Moist Heat;Traction;Ultrasound;Therapeutic exercise;Dry needling;Manual techniques;Neuromuscular re-education;Cryotherapy;Electrical Stimulation;Patient/family education;Passive range of motion    PT Next Visit Plan  cont MFR, taping, traction and gentle stretching/strengthening exercises for UE/neck.     Consulted and Agree with Plan of Care  Patient       Patient will benefit from skilled therapeutic intervention in order to improve the following deficits and impairments:  Decreased range of motion, Impaired UE functional use, Pain, Decreased strength  Visit Diagnosis: Other symptoms and signs involving the musculoskeletal system  Muscle weakness (generalized)     Problem List Patient Active Problem List   Diagnosis Date Noted  . Low ferritin 06/02/2017  . Vitamin D deficiency 06/02/2017  . Metatarsalgia 05/26/2017  . Paresthesia of upper extremity 05/11/2017  . Menorrhagia with regular cycle 04/07/2017  . Family history of MI (myocardial infarction) 04/07/2017  . Family history of thyroid disease 04/07/2017  . 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

## 2017-06-05 ENCOUNTER — Ambulatory Visit (INDEPENDENT_AMBULATORY_CARE_PROVIDER_SITE_OTHER): Payer: BLUE CROSS/BLUE SHIELD | Admitting: Physical Therapy

## 2017-06-05 DIAGNOSIS — M6281 Muscle weakness (generalized): Secondary | ICD-10-CM

## 2017-06-05 DIAGNOSIS — R29898 Other symptoms and signs involving the musculoskeletal system: Secondary | ICD-10-CM | POA: Diagnosis not present

## 2017-06-05 NOTE — Therapy (Signed)
[place], [place], [address] Phone: [phone]   Fax:  [phone]  Physical Therapy Treatment  Patient Details  Name: Cynthia Weber MRN: [id number] Date of Birth: 22-Mar-1976 Referring Provider: Dr. Georgina Snell    Encounter Date: 06/05/2017  PT End of Session - 06/05/17 1153    Visit Number  11    Number of Visits  12    Date for PT Re-Evaluation  06/10/17    Authorization Type  30 visits per year.     PT Start Time  1103    PT Stop Time  1200    PT Time Calculation (min)  57 min    Activity Tolerance  Patient tolerated treatment well    Behavior During Therapy  [facility] for tasks assessed/performed       No past medical history on file.  Past Surgical History:  Procedure Laterality Date  . SINUS SURGERY WITH INSTATRAK  02/27/2017  . tummy tuck  2014    There were no vitals filed for this visit.  Subjective Assessment - 06/05/17 1149    Subjective  Pt reports both her arms feel worse today. She didn't hardly sleep last night due to constant pain/ numbness in hands. She went to yoga today, as well as had a massage with focus to neck.  She has difficult time with planks and downward dog due to pain/pressure in hands. She also has a cough she can't shake, she plans to go to [employer] for some natural remedies.     Patient Stated Goals  stop hand numbess, write without difficulty    Currently in Pain?  Yes    Pain Score  4     Pain Location  Arm    Pain Orientation  Right;Left Lt>Rt    Pain Radiating Towards  shoulders to wrists    Aggravating Factors   positional, certain activities involving use of arms     Pain Relieving Factors  repositioning.          [facility] PT Assessment - 06/05/17 0001      Assessment   Medical Diagnosis  cubital tunnel syndrome Rt, cervical radiculopathy    Referring Provider  Dr. Georgina Snell     Onset Date/Surgical Date  10/28/16    Hand Dominance  Right     Next MD Visit  not scheduled yet.         [facility] Adult PT Treatment/Exercise - 06/05/17 0001      Electrical Stimulation   Electrical Stimulation Location  Lt post deltoid, prox tricep, infraspinatus, levator, Lt cerv paraspinals    Electrical Stimulation Action  combo [place]    Electrical Stimulation Parameters  to tolerance     Electrical Stimulation Goals  Tone;Pain      Ultrasound   Ultrasound Location  see estim     Ultrasound Parameters  combo [place], 100%, 1.1 w/cm2, 10 min     Ultrasound Goals  Pain tone      Traction   Type of Traction  Cervical    Min (lbs)  8    Max (lbs)  15    Hold Time  60    Rest Time  20    Time  19      Manual Therapy   Manual therapy comments  pt supine for forearm and ant bilat arm work, prone for post shoulder/tricep work.  Regular rock tape applied to Lt wrist flexors up to lateral elbow, I strip  following Lt deep back arm line from elbow to rhomboid.  Tape applied with 10% stretch to decrease pain, inhibit muscle.     Soft tissue mobilization  Edge tool assistance to Lt tricep, post deltoid, infraspinatus, levator, upper trap;  STM to Lt wrist flexors and Lt/ Rt tricep, Rt post shoulder .     Myofascial Release  MFR to Lt ant/post superficial/deep arm line       Neck Exercises: Stretches   Upper Trapezius Stretch  2 reps;30 seconds    Other Neck Stretches  RUE: low, mid, and high level doorway stretch x 30 x 3 reps             PT Education - 06/05/17 1152    Education provided  Yes    Education Details  pt encouraged to continue stretches, hold off on any planks, downward dogs, push ups.     Person(s) Educated  Patient    Methods  Explanation    Comprehension  Verbalized understanding          PT Long Term Goals - 06/05/17 1239      PT LONG TERM GOAL #1   Title  I with HEP for nerve glides ( 06/10/17)     Time  6    Period  Weeks    Status  Achieved      PT LONG TERM GOAL #2   Title  improve FOTO =/< 38% limited ( 06/10/17)      Time  6    Period  Weeks    Status  On-going      PT LONG TERM GOAL #3   Title  demo Rt grip strength =/> Lt and no reports of feeling like she will drop something ( 06/10/17)     Time  6    Period  Weeks    Status  Achieved      PT LONG TERM GOAL #4   Title  report =/> 75% reduction in reports of numbness and tingling in her arms ( 06/10/17)     Time  6    Period  Weeks    Status  On-going currently fluctuates       PT LONG TERM GOAL #5   Title  report ability to sleep per her previous level and not wake up due to numbness/tingling in her arms ( 06/10/17)     Time  6    Period  Weeks    Status  On-going currently fluctuates      PT LONG TERM GOAL #6   Title  demo strong contraction of multifidi and TA to stabilize core ( 06/10/17)     Time  6    Period  Weeks    Status  On-going            Plan - 06/05/17 1208    Clinical Impression Statement  Pt had reflare of symptoms in UE since last session.  Unsure if her current yoga practice has exacerbated her symptoms over last day.  Manual therapy and combo [place] calmed symptoms today.  No new goals met due to recent flare.     Rehab Potential  Good    PT Frequency  2x / week    PT Duration  6 weeks    PT Treatment/Interventions  Moist Heat;Traction;Ultrasound;Therapeutic exercise;Dry needling;Manual techniques;Neuromuscular re-education;Cryotherapy;Electrical Stimulation;Patient/family education;Passive range of motion    PT Next Visit Plan  cont MFR, taping, traction and gentle stretching/strengthening exercises for UE/neck. Assess goals, end of  POC.     Consulted and Agree with Plan of Care  Patient       Patient will benefit from skilled therapeutic intervention in order to improve the following deficits and impairments:  Decreased range of motion, Impaired UE functional use, Pain, Decreased strength  Visit Diagnosis: Other symptoms and signs involving the musculoskeletal system  Muscle weakness  (generalized)     Problem List Patient Active Problem List   Diagnosis Date Noted  . Low ferritin 06/02/2017  . Vitamin D deficiency 06/02/2017  . Metatarsalgia 05/26/2017  . Paresthesia of upper extremity 05/11/2017  . Menorrhagia with regular cycle 04/07/2017  . Family history of MI (myocardial infarction) 04/07/2017  . Family history of thyroid disease 04/07/2017  . 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

## 2017-06-08 ENCOUNTER — Ambulatory Visit (INDEPENDENT_AMBULATORY_CARE_PROVIDER_SITE_OTHER): Payer: BLUE CROSS/BLUE SHIELD | Admitting: Physical Therapy

## 2017-06-08 DIAGNOSIS — R29898 Other symptoms and signs involving the musculoskeletal system: Secondary | ICD-10-CM

## 2017-06-08 DIAGNOSIS — M6281 Muscle weakness (generalized): Secondary | ICD-10-CM | POA: Diagnosis not present

## 2017-06-08 NOTE — Therapy (Signed)
[place], [place], [address] Phone: [phone]   Fax:  [phone]  Physical Therapy Treatment  Patient Details  Name: Cynthia Weber MRN: [id number] Date of Birth: 11-06-1975 Referring Provider: Dr. Georgina Snell    Encounter Date: 06/08/2017  PT End of Session - 06/08/17 0934    Visit Number  12    Number of Visits  18    Date for PT Re-Evaluation  07/20/17    Authorization Type  30 visits per year though April 2019    PT Start Time  0933    PT Stop Time  1038    PT Time Calculation (min)  65 min    Behavior During Therapy  [facility] for tasks assessed/performed       No past medical history on file.  Past Surgical History:  Procedure Laterality Date  . SINUS SURGERY WITH INSTATRAK  02/27/2017  . tummy tuck  2014    There were no vitals filed for this visit.  Subjective Assessment - 06/08/17 0934    Subjective  Numbness is still happening in her UE, but since last visit she has only woken up 5-6x/night.  She is now taking supplements for iron and D.  She had a massage this morning; she feels like it helps with neck ROM, but hasn't helped the numbness and tingling.  She reports she didn't feel as much of a difference with Rock tape as she did the first time it was applied to LUE.      Currently in Pain?  Yes    Pain Score  3     Pain Location  Arm    Pain Orientation  Right;Left    Aggravating Factors   positional, certain activities with arms.       Pain Relieving Factors  repositioning         [facility] PT Assessment - 06/08/17 0001      Assessment   Medical Diagnosis  cubital tunnel syndrome Rt, cervical radiculopathy    Referring Provider  Dr. Georgina Snell     Onset Date/Surgical Date  10/28/16    Hand Dominance  Right    Next MD Visit  not scheduled yet.       Observation/Other Assessments   Focus on Therapeutic Outcomes (FOTO)   36% limited      Palpation   Spinal mobility  hypomobile in  T1-7, with tenderness  assess by Cynthia Weber, PT                  [facility] Adult PT Treatment/Exercise - 06/08/17 0001      Self-Care   Self-Care  Other Self-Care Comments    Other Self-Care Comments   pt taught self MFR work with ball to bilat pecs, and self mobilization with massage blocks; pt verbalized understanding and returned demo.       Neck Exercises: Supine   Other Supine Exercise  snow angels x 10 to tolerance; nerve glides Rt/Lt x 5 reps to tolerance       Moist Heat Therapy   Number Minutes Moist Heat  19 Minutes    Moist Heat Location  Shoulder;Cervical      Electrical Stimulation   Electrical Stimulation Location  bilat cervical paraspinals/ bilat mid thoracic paraspinals    Electrical Stimulation Action  IFC    Electrical Stimulation Parameters   to tolerance     Electrical Stimulation Goals  Pain;Tone      Traction   Type  of Traction  Cervical    Min (lbs)  8    Max (lbs)  15    Hold Time  60    Rest Time  20    Time  19      Manual Therapy   Manual therapy comments  waterproof green rock tape applied to Lt elbow up to lateral elbow, I strip following Lt deep back arm line from elbow to rhomboid.  Tape applied with 10% stretch to decrease pain, inhibit muscle.     Joint Mobilization  grade III CPA and bilat UPA mobs upper and mid thoracic, increased mobility in the thoracic, rib mobs (tender on Lt at ~T4/5 anteriorly) provided by Cynthia Weber, PT    Soft tissue mobilization  to Rt/ Lt pec minor     Myofascial Release  MFR to Rt/Lt pec, Rt scalenes, platysma        PT Long Term Goals - 06/08/17 1328      PT LONG TERM GOAL #1   Title  I with HEP for nerve glides ( 06/10/17)     Time  6    Period  Weeks    Status  Achieved      PT LONG TERM GOAL #2   Title  improve FOTO =/< 38% limited ( 06/10/17)     Time  6    Period  Weeks    Status  Achieved      PT LONG TERM GOAL #3   Title  demo Rt grip strength =/> Lt and no reports of feeling like  she will drop something ( 06/10/17)     Time  6    Period  Weeks    Status  Achieved      PT LONG TERM GOAL #4   Title  report =/> 75% reduction in reports of numbness and tingling in her arms ( 07/20/17)     Time  6    Period  Weeks    Status  On-going      PT LONG TERM GOAL #5   Title  report ability to sleep per her previous level and not wake up due to numbness/tingling in her arms ( 07/20/17)     Time  6    Period  Weeks    Status  On-going      PT LONG TERM GOAL #6   Title  demo strong contraction of multifidi and TA to stabilize core ( 06/10/17)     Time  6    Period  Weeks    Status  Deferred            Plan - 06/08/17 1329    Clinical Impression Statement  Pt's symptoms have calmed down since last visit.  She is now waking 5x in night from numbness in hands.  She reported numbness in hand with ball release work to [employer] minor; pulse in that hand diminished but not absent at that same time. Cynthia Weber had symptoms into her arms and scapular are with anterior rib mobs, has a lot of fascial tightness/restrictions. Wondering if some of this may be due to breast augmentation. She responds well to manual work at this time. Pt reported elimination of pain at end of session. Pt making gradual gains towards remaining goals.  She will benefit from continued PT to maximize functional mobility.     Rehab Potential  Good    PT Frequency  1x / week    PT Duration  6 weeks  PT Treatment/Interventions  Moist Heat;Traction;Ultrasound;Therapeutic exercise;Dry needling;Manual techniques;Neuromuscular re-education;Cryotherapy;Electrical Stimulation;Patient/family education;Passive range of motion    PT Next Visit Plan  spoke to supervising PT regarding pt's progress; will request additional visits.     Consulted and Agree with Plan of Care  Patient       Patient will benefit from skilled therapeutic intervention in order to improve the following deficits and impairments:  Decreased range  of motion, Impaired UE functional use, Pain, Decreased strength  Visit Diagnosis: Other symptoms and signs involving the musculoskeletal system - Plan: PT plan of care cert/re-cert  Muscle weakness (generalized) - Plan: PT plan of care cert/re-cert     Problem List Patient Active Problem List   Diagnosis Date Noted  . Low ferritin 06/02/2017  . Vitamin D deficiency 06/02/2017  . Metatarsalgia 05/26/2017  . Paresthesia of upper extremity 05/11/2017  . Menorrhagia with regular cycle 04/07/2017  . Family history of MI (myocardial infarction) 04/07/2017  . Family history of thyroid disease 04/07/2017  . 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 [place], [place], [address] Phone: [phone]   Fax:  [phone]  Name: Cynthia Weber MRN: [id number] Date of Birth: July 16, 1976

## 2017-06-12 ENCOUNTER — Encounter: Payer: BLUE CROSS/BLUE SHIELD | Admitting: Physical Therapy

## 2017-06-15 ENCOUNTER — Encounter: Payer: BLUE CROSS/BLUE SHIELD | Admitting: Physical Therapy

## 2017-06-23 ENCOUNTER — Ambulatory Visit: Payer: Self-pay | Admitting: Osteopathic Medicine

## 2017-07-03 ENCOUNTER — Ambulatory Visit (INDEPENDENT_AMBULATORY_CARE_PROVIDER_SITE_OTHER): Payer: BLUE CROSS/BLUE SHIELD | Admitting: Physical Therapy

## 2017-07-03 DIAGNOSIS — R29898 Other symptoms and signs involving the musculoskeletal system: Secondary | ICD-10-CM | POA: Diagnosis not present

## 2017-07-03 DIAGNOSIS — M6281 Muscle weakness (generalized): Secondary | ICD-10-CM | POA: Diagnosis not present

## 2017-07-03 NOTE — Therapy (Addendum)
[place], [place], [address] Phone: [phone]   Fax:  [phone]  Physical Therapy Treatment  Patient Details  Name: Cynthia Weber MRN: [id number] Date of Birth: Dec 24, 1975 Referring Provider: Dr. Georgina Snell   Encounter Date: 07/03/2017  PT End of Session - 07/03/17 0820    Visit Number  13    Number of Visits  18    Date for PT Re-Evaluation  07/20/17    Authorization Type  30 visits per year.     PT Start Time  0805    PT Stop Time  0906    PT Time Calculation (min)  61 min       No past medical history on file.  Past Surgical History:  Procedure Laterality Date  . SINUS SURGERY WITH INSTATRAK  02/27/2017  . tummy tuck  2014    There were no vitals filed for this visit.  Subjective Assessment - 07/03/17 0812    Subjective  Pt returns from being away from therapy for almost a month due to holidays.  She continues to have symptoms in arms and continued tightness in neck, chest and BUE despite stretching every day.  She goes to yoga 1x/wk.   her hands are going back to sleep muliple times per night.     Currently in Pain?  Yes    Pain Score  4     Pain Location  Arm    Pain Orientation  Left;Right    Aggravating Factors   certain activities with arms     Pain Relieving Factors  repositioning.          [facility] PT Assessment - 07/03/17 0001      Assessment   Medical Diagnosis  cubital tunnel syndrome Rt, cervical radiculopathy    Referring Provider  Dr. Georgina Snell    Onset Date/Surgical Date  10/28/16    Hand Dominance  Right    Next MD Visit  not scheduled yet.        [facility] Adult PT Treatment/Exercise - 07/03/17 0001      Neck Exercises: Machines for Strengthening   UBE (Upper Arm Bike)  L1: 1 min forward/ 1 min backward hands began to go numb; stopped.       Neck Exercises: Seated   Other Seated Exercise  ulnar nerve glides x 10 reps each side; median nerve glide x 5 reps each  side.       Neck Exercises: Prone   Other Prone Exercise  childs pose with lateral flexion x 2 reps each side.       Moist Heat Therapy   Number Minutes Moist Heat  20 Minutes    Moist Heat Location  Cervical;Shoulder;Elbow      Electrical Stimulation   Electrical Stimulation Location  bilat upper trap and thoracic paraspinals    Electrical Stimulation Action  IFC    Electrical Stimulation Parameters   to tolerance     Electrical Stimulation Goals  Pain;Tone      Manual Therapy   Manual therapy comments  2" dynamic tape applied with 10% stretch to Lt/Rt  lateral forearm up to lateral distal tricep; I strip of dynamic tape following Lt deep back arm line from mid tricap to top of shoulder blade. I strip of dynamic tape applied to post lateral shoulder to rhomboid to encourage scap retraction, increased awareness. Tape applied with 10% stretch to decrease pain, inhibit muscle.     Joint Mobilization  grade II  rib mobs (tender on Lt at ~T6-8 posteriorly. provided by Jeral Pinch, PT    Soft tissue mobilization  Edge tool assistance to Rt and Lt tricep, post deltoid, infraspinatus, rhomboid, thoracic paraspinals;  STM to Lt Lt levator. STM to mid thoracic paraspinals bilat.       Neck Exercises: Stretches   Other Neck Stretches  low and mid level doorway stretch bilat x 30 x 3 reps, unilateral high doorway stretch x 30 each arm.                   PT Long Term Goals - 07/03/17 8032      PT LONG TERM GOAL #1   Title  I with HEP for nerve glides ( 06/10/17)     Time  6    Period  Weeks    Status  Achieved      PT LONG TERM GOAL #2   Title  improve FOTO =/< 38% limited ( 06/10/17)     Period  Weeks    Status  Achieved      PT LONG TERM GOAL #3   Title  demo Rt grip strength =/> Lt and no reports of feeling like she will drop something ( 06/10/17)     Time  6    Period  Weeks    Status  Achieved      PT LONG TERM GOAL #4   Title  report =/> 75% reduction in reports of  numbness and tingling in her arms ( 07/20/17)     Time  6    Period  Weeks    Status  On-going      PT LONG TERM GOAL #5   Title  report ability to sleep per her previous level and not wake up due to numbness/tingling in her arms ( 07/20/17)     Time  6    Period  Weeks    Status  On-going      PT LONG TERM GOAL #6   Title  demo strong contraction of multifidi and TA to stabilize core ( 06/10/17)     Time  6    Period  Weeks    Status  Deferred            Plan - 07/03/17 0903    Clinical Impression Statement  Pt continues to exhibit thoracic outlet like symptoms with arm stretches.  Pt did not have breast augmentation nor any other surgery that may have caused fascial tightness anteriorly.  Thoracic musculature remains tight and tender; posterior rib mob at T6-8 also tender.  Pt reported reduction of symptoms at end of session.  Pt with limited progress on remaining goals thus far, but also had an extended break from therapy.      Rehab Potential  Good    PT Frequency  1x / week    PT Duration  6 weeks    PT Treatment/Interventions  Moist Heat;Traction;Ultrasound;Therapeutic exercise;Dry needling;Manual techniques;Neuromuscular re-education;Cryotherapy;Electrical Stimulation;Patient/family education;Passive range of motion    PT Next Visit Plan  Pt to ck insurance coverage to ensure she has not used up all visits (with PT and massage visits).  Assess response to dynamic tape and nerve glides.  cont manual therapy to cervical and thoracic musculature.     Consulted and Agree with Plan of Care  Patient       Patient will benefit from skilled therapeutic intervention in order to improve the following deficits and impairments:  Decreased range of motion,  Impaired UE functional use, Pain, Decreased strength  Visit Diagnosis: Other symptoms and signs involving the musculoskeletal system  Muscle weakness (generalized)     Problem List Patient Active Problem List   Diagnosis  Date Noted  . Low ferritin 06/02/2017  . Vitamin D deficiency 06/02/2017  . Metatarsalgia 05/26/2017  . Paresthesia of upper extremity 05/11/2017  . Menorrhagia with regular cycle 04/07/2017  . Family history of MI (myocardial infarction) 04/07/2017  . Family history of thyroid disease 04/07/2017  . 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  Patient goals were partially met. Patient is being discharged due to financial reasons.  ?????   Pt was concerned about co-pays and insurance coverage  Jeral Pinch, PT 09/01/17 8:54 AM

## 2017-07-13 ENCOUNTER — Other Ambulatory Visit (INDEPENDENT_AMBULATORY_CARE_PROVIDER_SITE_OTHER): Payer: Self-pay | Admitting: Otolaryngology

## 2017-07-13 DIAGNOSIS — J329 Chronic sinusitis, unspecified: Secondary | ICD-10-CM

## 2017-07-15 ENCOUNTER — Ambulatory Visit (INDEPENDENT_AMBULATORY_CARE_PROVIDER_SITE_OTHER): Payer: BLUE CROSS/BLUE SHIELD | Admitting: Obstetrics & Gynecology

## 2017-07-15 ENCOUNTER — Encounter: Payer: Self-pay | Admitting: Obstetrics & Gynecology

## 2017-07-15 VITALS — BP 157/94 | HR 93 | Ht 64.0 in | Wt 159.0 lb

## 2017-07-15 DIAGNOSIS — N920 Excessive and frequent menstruation with regular cycle: Secondary | ICD-10-CM

## 2017-07-15 DIAGNOSIS — N898 Other specified noninflammatory disorders of vagina: Secondary | ICD-10-CM

## 2017-07-15 NOTE — Progress Notes (Signed)
   Subjective:    Patient ID: Cynthia Weber, female    DOB: [birth year], 41 y.o.   MRN: [id number]  HPI  Patient comes to office reporting menorrhagia for 5-6 months.   Menarche at age 51.  Regular with no issues in 20s and 30s.  Cycle now lasts 32-38 days.  Mo molimina symptoms other than lethargy/headache the day before.  Since summer patinet has had 1 day of spotting and 3-4 days of very heavy bleeding and clots.  Returns to spotting. This is very new for the patient.  She uses cortisone interrupted for birth control. Patient had a vaginal delivery 18 years ago. No issues with infertility. Patient does have complaints of acne, hair growth on face and nipples, and hair thinning. Patient also complaining of vaginal irritation today with discharge.  Review of Systems  Constitutional: Negative.   Respiratory: Negative.   Cardiovascular: Negative.   Gastrointestinal: Negative.   Endocrine: Negative.   Genitourinary: Positive for menstrual problem, vaginal bleeding and vaginal discharge. Negative for dyspareunia and vaginal pain.  Skin:       Acne & hiar growth on face and nipples  Psychiatric/Behavioral: Negative.        Objective:   Physical Exam  Constitutional: She is oriented to person, place, and time. She appears well-developed and well-nourished. No distress.  HENT:  Head: Normocephalic and atraumatic.  Eyes: Conjunctivae are normal.  Pulmonary/Chest: Effort normal.  Abdominal: Soft. Bowel sounds are normal. She exhibits no distension. There is no tenderness.  Scar tissue from abdominoplasty, difficult to exam pelvic contents with bimanual  Genitourinary: Vaginal discharge found.  Genitourinary Comments: Scar tissue from abdominoplasty, difficult to exam pelvic contents with bimanual.  Uterus feels enlarged..  Ovaries unable to be palpated  Scant discharge Cervix without lesion  Musculoskeletal: She exhibits no edema.  Neurological: She is alert and oriented to person,  place, and time.  Skin: Skin is warm and dry.  Psychiatric: She has a normal mood and affect.  Vitals reviewed.  Vitals:   07/15/17 1019  BP: (!) 157/94  Pulse: 93  Weight: 159 lb (72.1 kg)  Height: 5\' 4"  (1.626 m)    Assessment & Plan:  41 year old female with menorrhagia for 5-6 months.  1-check fasting and splenic glucose. Check total testosterone. 2-pelvic and transvaginal ultrasound ordered 3-repeat blood pressure today. If still elevated patient to follow-up with primary care.

## 2017-07-15 NOTE — Progress Notes (Signed)
Pt c/o heavy periods. Pt states when she is on her period, she will have large blood clots come out & will bleed through a pad & tampon within 20 minutes. Pt states she has had an increase in acne & facial hair.

## 2017-07-16 LAB — CERVICOVAGINAL ANCILLARY ONLY
Bacterial vaginitis: NEGATIVE
Candida vaginitis: POSITIVE — AB

## 2017-07-17 ENCOUNTER — Other Ambulatory Visit: Payer: Self-pay | Admitting: Obstetrics & Gynecology

## 2017-07-17 ENCOUNTER — Ambulatory Visit
Admission: RE | Admit: 2017-07-17 | Discharge: 2017-07-17 | Disposition: A | Discharge status: Home / Self Care | Payer: BLUE CROSS/BLUE SHIELD | Source: Ambulatory Visit | Attending: Otolaryngology | Admitting: Otolaryngology

## 2017-07-17 DIAGNOSIS — J329 Chronic sinusitis, unspecified: Secondary | ICD-10-CM

## 2017-07-17 MED ORDER — FLUCONAZOLE 150 MG PO TABS: 150.0000 mg | ORAL_TABLET | Freq: Once | ORAL | 0 refills | Status: AC

## 2017-07-17 NOTE — Progress Notes (Signed)
Rx diflucan called in for yeast infection.

## 2017-07-24 ENCOUNTER — Other Ambulatory Visit: Payer: BLUE CROSS/BLUE SHIELD

## 2017-07-24 ENCOUNTER — Ambulatory Visit (INDEPENDENT_AMBULATORY_CARE_PROVIDER_SITE_OTHER): Payer: BLUE CROSS/BLUE SHIELD

## 2017-07-24 DIAGNOSIS — D259 Leiomyoma of uterus, unspecified: Secondary | ICD-10-CM

## 2017-07-24 DIAGNOSIS — N920 Excessive and frequent menstruation with regular cycle: Secondary | ICD-10-CM

## 2017-07-25 LAB — GLUCOSE, RANDOM: Glucose, Bld: 97 mg/dL (ref 65–99)

## 2017-07-25 LAB — INSULIN, RANDOM

## 2017-07-25 LAB — EXTRA GRAY-TOP TUBE

## 2017-07-27 ENCOUNTER — Encounter: Payer: Self-pay | Admitting: Obstetrics & Gynecology

## 2017-07-27 ENCOUNTER — Telehealth: Payer: Self-pay | Admitting: *Deleted

## 2017-07-27 DIAGNOSIS — D219 Benign neoplasm of connective and other soft tissue, unspecified: Secondary | ICD-10-CM | POA: Insufficient documentation

## 2017-07-27 LAB — TESTOSTERONE, TOTAL, LC/MS/MS: TESTOSTERONE, TOTAL, LC-MS-MS: 44 ng/dL (ref 2–45)

## 2017-07-27 MED ORDER — MISOPROSTOL 200 MCG PO TABS: ORAL_TABLET | ORAL | 0 refills | Status: DC

## 2017-07-27 NOTE — Telephone Encounter (Signed)
LM on voicemail that Dr Gala Romney would like to do endometrial biopsy @ her appt this week.  Will pre-med with Cytotec the night prior and RX was sent to [facility] in [place].

## 2017-07-27 NOTE — Telephone Encounter (Signed)
-----   Message from Guss Bunde, MD sent at 07/27/2017  9:51 AM EST ----- Large central fibroid.  I suggest an endometrial biopsy at next visit.  RN to call patient and premedicate with cytotec/ibuprofen.

## 2017-07-30 ENCOUNTER — Encounter: Payer: Self-pay | Admitting: Obstetrics & Gynecology

## 2017-07-30 ENCOUNTER — Ambulatory Visit (INDEPENDENT_AMBULATORY_CARE_PROVIDER_SITE_OTHER): Payer: BLUE CROSS/BLUE SHIELD | Admitting: Obstetrics & Gynecology

## 2017-07-30 VITALS — BP 130/77 | HR 82 | Ht 64.0 in | Wt 159.0 lb

## 2017-07-30 DIAGNOSIS — N920 Excessive and frequent menstruation with regular cycle: Secondary | ICD-10-CM

## 2017-07-30 DIAGNOSIS — E559 Vitamin D deficiency, unspecified: Secondary | ICD-10-CM

## 2017-07-30 DIAGNOSIS — R790 Abnormal level of blood mineral: Secondary | ICD-10-CM | POA: Diagnosis not present

## 2017-07-30 DIAGNOSIS — Z3202 Encounter for pregnancy test, result negative: Secondary | ICD-10-CM | POA: Diagnosis not present

## 2017-07-30 LAB — POCT URINE PREGNANCY: PREG TEST UR: NEGATIVE

## 2017-07-30 NOTE — Progress Notes (Signed)
   Subjective:    Patient ID: Cynthia Weber, female    DOB: [birth year], 42 y.o.   MRN: [id number]  HPI  Pt presents for results and endometrial biopsy,  Pt had heavy menses this past week.  Pt said she was homebound from the bleeding.  Pt has one large central fibroid.  Will do endometrial biopsy today to r.o malignancy.  She understands it could be a sarcoma but would be rare.    Review of Systems  Constitutional: Negative.   Cardiovascular: Negative.   Gastrointestinal: Negative.   Genitourinary: Negative for menstrual problem.       Objective:   Physical Exam  Constitutional: She is oriented to person, place, and time. She appears well-developed and well-nourished. No distress.  HENT:  Head: Normocephalic and atraumatic.  Eyes: Conjunctivae are normal.  Pulmonary/Chest: Effort normal.  Abdominal: Soft. Bowel sounds are normal. There is no tenderness.  Genitourinary:  Genitourinary Comments: nml vagina Enlarge uterus (14-16 weeks)  Musculoskeletal: She exhibits no edema.  Neurological: She is alert and oriented to person, place, and time.  Skin: Skin is warm and dry.  Psychiatric: She has a normal mood and affect.  Vitals reviewed.  Vitals:   07/30/17 1422  BP: 130/77  Pulse: 82  Weight: 159 lb (72.1 kg)  Height: 5\' 4"  (1.626 m)    Assessment & Plan:  42 yo female with menorrhagia with regular cycle nad large central fibroid.  1.  Discussed options to control bleeding.  Pt does not want medications or iUD.  Considering myomectomy vs hysterectomy.  Will need to discuss [place] more.  2.  Pt requesting f/u Vit D and ferritin levels.  3..  Endometrial Bx  ENDOMETRIAL BIOPSY     The indications for endometrial biopsy were reviewed.   Risks of the biopsy including cramping, bleeding, infection, uterine perforation, inadequate specimen and need for additional procedures  were discussed. The patient states she understands and agrees to undergo procedure today. Consent  was signed. Time out was performed. Urine HCG was negative. A sterile speculum was placed in the patient's vagina and the cervix was prepped with Betadine. A single-toothed tenaculum was placed on the anterior lip of the cervix to stabilize it. The 3 mm pipelle was introduced into the endometrial cavity without difficulty to a depth of 12 cm, and a moderate amount of tissue was obtained and sent to pathology. The instruments were removed from the patient's vagina. Minimal bleeding from the cervix was noted. The patient tolerated the procedure well. Routine post-procedure instructions were given to the patient. The patient will follow up to review the results and for further management.    20 minutes spent face to face with patient with >50% counseling

## 2017-07-31 LAB — VITAMIN D 25 HYDROXY (VIT D DEFICIENCY, FRACTURES): VIT D 25 HYDROXY: 26 ng/mL — AB (ref 30–100)

## 2017-07-31 LAB — FERRITIN: FERRITIN: 11 ng/mL (ref 10–232)

## 2017-08-10 ENCOUNTER — Ambulatory Visit (INDEPENDENT_AMBULATORY_CARE_PROVIDER_SITE_OTHER): Payer: BLUE CROSS/BLUE SHIELD | Admitting: Obstetrics & Gynecology

## 2017-08-10 ENCOUNTER — Encounter: Payer: Self-pay | Admitting: Obstetrics & Gynecology

## 2017-08-10 VITALS — BP 131/80 | HR 88 | Ht 64.0 in | Wt 159.0 lb

## 2017-08-10 DIAGNOSIS — N920 Excessive and frequent menstruation with regular cycle: Secondary | ICD-10-CM | POA: Diagnosis not present

## 2017-08-13 NOTE — Progress Notes (Signed)
   Subjective:    Patient ID: Cynthia Weber, female    DOB: 01-Apr-1976, 42 y.o.   MRN: [id number]  HPI  42 yo female presents for Endometrial biopsy results and plan of care for menorrhagia and large fibroid.  Pt has concerns about hysterectomy.  Considering myomectomy and supracervical hysterectomy.  Pt not completely sure she is done with childbearing--needs to talk with partner.  He is younger at age 26.  Pt does not want IUD or OCPs.  Pt has been unhappy with the results of her abdominoplasty.  She wants a revision and had consult with [facility] MD.  The consult did not connect well with MD. Patient'sl take away message is that MD does not think revision of scar will improve results.  An umbilical revision (pt thinks umbilicus is too dark) could be done for $700 in the office vs expense of OR at time of hysterectomy.     Review of Systems  Constitutional: Negative.   Respiratory: Negative.   Cardiovascular: Negative.   Gastrointestinal: Negative.   Genitourinary: Positive for menstrual problem.  Psychiatric/Behavioral: Negative.        Objective:   Physical Exam  Constitutional: She is oriented to person, place, and time. She appears well-developed and well-nourished. No distress.  HENT:  Head: Normocephalic and atraumatic.  Eyes: Conjunctivae are normal.  Pulmonary/Chest: Effort normal.  Abdominal: Soft. Bowel sounds are normal. She exhibits mass. She exhibits no distension. There is no tenderness. There is no rebound and no guarding.  Fibroid palpated through abdominal wall--16 weeks  Musculoskeletal: She exhibits no edema.  Neurological: She is alert and oriented to person, place, and time.  Skin: Skin is warm and dry.  Psychiatric: She has a normal mood and affect.  Vitals reviewed.  Vitals:   08/10/17 1453  BP: 131/80  Pulse: 88  Weight: 159 lb (72.1 kg)  Height: 5\' 4"  (1.626 m)    Assessment & Plan:  42 yo female with enlarge fibroid and menorrhagia Long  discussion about options; pt wants to time surgery with insurance.  She is undecided about myomectomy vs supracervical hysterectomy.  Pt will discuss with partner and send me a message through my chart about her decision.   Continue iron and vitamin D.  25 minutes spent face to face with patient with >50% counseling.

## 2017-08-14 ENCOUNTER — Encounter: Payer: Self-pay | Admitting: Obstetrics & Gynecology

## 2017-09-17 ENCOUNTER — Encounter (HOSPITAL_COMMUNITY): Payer: Self-pay

## 2017-10-27 ENCOUNTER — Telehealth: Payer: Self-pay | Admitting: Obstetrics & Gynecology

## 2017-10-27 ENCOUNTER — Encounter: Payer: Self-pay | Admitting: Obstetrics & Gynecology

## 2017-10-27 ENCOUNTER — Ambulatory Visit (INDEPENDENT_AMBULATORY_CARE_PROVIDER_SITE_OTHER): Payer: BLUE CROSS/BLUE SHIELD | Admitting: Obstetrics & Gynecology

## 2017-10-27 VITALS — BP 144/88 | HR 91 | Wt 153.0 lb

## 2017-10-27 DIAGNOSIS — D259 Leiomyoma of uterus, unspecified: Secondary | ICD-10-CM | POA: Diagnosis not present

## 2017-10-27 DIAGNOSIS — Z01818 Encounter for other preprocedural examination: Secondary | ICD-10-CM

## 2017-10-27 DIAGNOSIS — Z3202 Encounter for pregnancy test, result negative: Secondary | ICD-10-CM

## 2017-10-27 LAB — POCT URINE PREGNANCY: PREG TEST UR: NEGATIVE

## 2017-10-27 NOTE — Telephone Encounter (Signed)
Pt wants Gala Romney to know she made appt with Dr Marla Roe for Tue April 9th at 8:15.

## 2017-10-28 NOTE — Telephone Encounter (Signed)
Thanks

## 2017-10-30 NOTE — Progress Notes (Signed)
   Subjective:    Patient ID: Cynthia Weber, female    DOB: 1975-09-30, 42 y.o.   MRN: [id number]  HPI  42 yo female presents to discuss surgery.  Pt was considering hysterectomy vs myomectomy.  Pt would like to keep her uterus.  She is unsure if she wants to have another child.  Pt having very heavy menorrhagia and mass effects from her large fibroid uterus.  She declines all medications to manage pain and bleeding.  Pt understands that there is a chance that she still may have menorrhagia after the myomectomy.  She also understands that primary c/s would be necessary at 36-37 weeks after myomectomy.  She understands risk of scarring.  Pt has had abdominoplasty.  What was once skin above her umbilicus is now at her abdominoplasty scar (about 2 cm above pubic symphysis.    Review of Systems  Constitutional: Negative.   Respiratory: Negative.   Cardiovascular: Negative.   Gastrointestinal: Negative.   Genitourinary: Positive for menstrual problem and vaginal bleeding.  Musculoskeletal: Negative.   Psychiatric/Behavioral: Negative.        Objective:   Physical Exam  Constitutional: She is oriented to person, place, and time. She appears well-developed and well-nourished. No distress.  HENT:  Head: Normocephalic and atraumatic.  Eyes: Conjunctivae are normal.  Cardiovascular: Normal rate.  Pulmonary/Chest: Effort normal.  Abdominal: Soft. Bowel sounds are normal. She exhibits mass. There is no tenderness. There is no guarding.  Fibroid uterus palpated through abdominal wall.  Musculoskeletal: She exhibits no edema.  Neurological: She is alert and oriented to person, place, and time.  Skin: Skin is warm and dry.  Psychiatric: She has a normal mood and affect.  Vitals reviewed.  Vitals:   10/27/17 1542  BP: (!) 144/88  Pulse: 91  Weight: 153 lb (69.4 kg)       Assessment & Plan:  43 yo female presents for counseling about hysterectomy vs myomectomy.    1.  Myomectomy (see  HPI for further discussion points) 2.  Given distortion of anatomy, would like for plastic surgeon to scrub to help reduce risk of hernia formation from difficult dissection.  Pt has appt with Dr. Marla Roe on 11/03/17.  25 minutes spent face to face with patient with >50% counseling.

## 2017-11-03 NOTE — Patient Instructions (Addendum)
Your procedure is scheduled on:  Thursday, April 18  Enter through the Main Entrance of [facility] at: 6 am  Pick up the phone at the desk and dial [phone].  Call this number if you have problems the morning of surgery: [phone].  Remember: Do NOT eat or Do NOT drink clear liquids (including water) after midnight Wednesday.  Take these medicines the morning of surgery with a SIP OF WATER: None  Stop herbal medications, vitamin supplements and ibuprofen at this time.  Do NOT wear jewelry (body piercing), metal hair clips/bobby pins, make-up, or nail polish. Do NOT wear lotions, powders, or perfumes.  You may wear deoderant. Do NOT shave for 48 hours prior to surgery. Do NOT bring valuables to the hospital. Contacts may not be worn into surgery.  Leave suitcase in car.  After surgery it may be brought to your room.  For patients admitted to the hospital, checkout time is 11:00 AM the day of discharge. Home with Mother Kalman Shan cell [phone] or Boyfriend Aileen Pilot cell [phone] or Daughter Denton Ar cell [phone].

## 2017-11-09 ENCOUNTER — Other Ambulatory Visit: Payer: Self-pay

## 2017-11-09 ENCOUNTER — Encounter (HOSPITAL_COMMUNITY)
Admission: RE | Admit: 2017-11-09 | Discharge: 2017-11-09 | Disposition: A | Discharge status: Home / Self Care | Payer: BLUE CROSS/BLUE SHIELD | Source: Ambulatory Visit | Attending: Obstetrics & Gynecology | Admitting: Obstetrics & Gynecology

## 2017-11-09 ENCOUNTER — Encounter (HOSPITAL_COMMUNITY): Payer: Self-pay

## 2017-11-09 HISTORY — DX: Anemia, unspecified: D64.9

## 2017-11-09 LAB — TYPE AND SCREEN
ABO/RH(D): A POS
Antibody Screen: NEGATIVE

## 2017-11-09 LAB — CBC
HEMATOCRIT: 41.8 % (ref 36.0–46.0)
HEMOGLOBIN: 14.8 g/dL (ref 12.0–15.0)
MCH: 33 pg (ref 26.0–34.0)
MCHC: 35.4 g/dL (ref 30.0–36.0)
MCV: 93.3 fL (ref 78.0–100.0)
Platelets: 276 10*3/uL (ref 150–400)
RBC: 4.48 MIL/uL (ref 3.87–5.11)
RDW: 12.3 % (ref 11.5–15.5)
WBC: 6.9 10*3/uL (ref 4.0–10.5)

## 2017-11-09 LAB — ABO/RH: ABO/RH(D): A POS

## 2017-11-12 ENCOUNTER — Inpatient Hospital Stay (HOSPITAL_COMMUNITY): Payer: BLUE CROSS/BLUE SHIELD | Admitting: Anesthesiology

## 2017-11-12 ENCOUNTER — Inpatient Hospital Stay (HOSPITAL_COMMUNITY)
Admission: AD | Admit: 2017-11-12 | Discharge: 2017-11-14 | DRG: 743 | Disposition: A | Discharge status: Home / Self Care | Payer: BLUE CROSS/BLUE SHIELD | Source: Ambulatory Visit | Attending: Obstetrics & Gynecology | Admitting: Obstetrics & Gynecology

## 2017-11-12 ENCOUNTER — Other Ambulatory Visit: Payer: Self-pay

## 2017-11-12 ENCOUNTER — Encounter (HOSPITAL_COMMUNITY)
Admission: AD | Disposition: A | Discharge status: Home / Self Care | Payer: Self-pay | Source: Ambulatory Visit | Attending: Obstetrics & Gynecology

## 2017-11-12 ENCOUNTER — Encounter (HOSPITAL_COMMUNITY): Payer: Self-pay

## 2017-11-12 DIAGNOSIS — N838 Other noninflammatory disorders of ovary, fallopian tube and broad ligament: Secondary | ICD-10-CM | POA: Diagnosis present

## 2017-11-12 DIAGNOSIS — Z9889 Other specified postprocedural states: Secondary | ICD-10-CM

## 2017-11-12 DIAGNOSIS — D259 Leiomyoma of uterus, unspecified: Principal | ICD-10-CM | POA: Diagnosis present

## 2017-11-12 DIAGNOSIS — N946 Dysmenorrhea, unspecified: Secondary | ICD-10-CM | POA: Diagnosis present

## 2017-11-12 DIAGNOSIS — N920 Excessive and frequent menstruation with regular cycle: Secondary | ICD-10-CM | POA: Diagnosis present

## 2017-11-12 HISTORY — PX: PRIMARY CLOSURE: SHX6224

## 2017-11-12 HISTORY — PX: MYOMECTOMY: SHX85

## 2017-11-12 LAB — CBC
HCT: 38.5 % (ref 36.0–46.0)
HCT: 36.1 % (ref 36.0–46.0)
Hemoglobin: 13.3 g/dL (ref 12.0–15.0)
Hemoglobin: 12.8 g/dL (ref 12.0–15.0)
MCH: 32.2 pg (ref 26.0–34.0)
MCH: 32.9 pg (ref 26.0–34.0)
MCHC: 34.5 g/dL (ref 30.0–36.0)
MCHC: 35.5 g/dL (ref 30.0–36.0)
MCV: 93.2 fL (ref 78.0–100.0)
MCV: 92.8 fL (ref 78.0–100.0)
PLATELETS: 230 10*3/uL (ref 150–400)
Platelets: 266 10*3/uL (ref 150–400)
RBC: 4.13 MIL/uL (ref 3.87–5.11)
RBC: 3.89 MIL/uL (ref 3.87–5.11)
RDW: 12.3 % (ref 11.5–15.5)
RDW: 12.2 % (ref 11.5–15.5)
WBC: 22.8 10*3/uL — AB (ref 4.0–10.5)
WBC: 17.2 10*3/uL — AB (ref 4.0–10.5)

## 2017-11-12 LAB — PREGNANCY, URINE: PREG TEST UR: NEGATIVE

## 2017-11-12 SURGERY — MYOMECTOMY, ABDOMINAL APPROACH
Anesthesia: General | Site: Abdomen

## 2017-11-12 MED ORDER — HYDROMORPHONE 1 MG/ML IV SOLN
INTRAVENOUS | Status: DC
  Administered 2017-11-12: 12:00:00 via INTRAVENOUS
  Administered 2017-11-12: 1.4 mL via INTRAVENOUS
  Administered 2017-11-12: 1.4 mg via INTRAVENOUS
  Administered 2017-11-13 (×2): 0.2 mg via INTRAVENOUS
  Filled 2017-11-12: qty 25

## 2017-11-12 MED ORDER — KETOROLAC TROMETHAMINE 30 MG/ML IJ SOLN
30.0000 mg | Freq: Four times a day (QID) | INTRAMUSCULAR | Status: DC
  Administered 2017-11-13: 30 mg via INTRAMUSCULAR

## 2017-11-12 MED ORDER — ACETAMINOPHEN 10 MG/ML IV SOLN
INTRAVENOUS | Status: DC | PRN
  Administered 2017-11-12: 1000 mg via INTRAVENOUS

## 2017-11-12 MED ORDER — LIDOCAINE HCL 1 % IJ SOLN
INTRAMUSCULAR | Status: AC
  Filled 2017-11-12: qty 20

## 2017-11-12 MED ORDER — LIDOCAINE HCL (CARDIAC) PF 100 MG/5ML IV SOSY
PREFILLED_SYRINGE | INTRAVENOUS | Status: AC
  Filled 2017-11-12: qty 5

## 2017-11-12 MED ORDER — SUGAMMADEX SODIUM 200 MG/2ML IV SOLN
INTRAVENOUS | Status: DC | PRN
  Administered 2017-11-12: 150 mg via INTRAVENOUS

## 2017-11-12 MED ORDER — PROPOFOL 10 MG/ML IV BOLUS
INTRAVENOUS | Status: AC
  Filled 2017-11-12: qty 20

## 2017-11-12 MED ORDER — VASOPRESSIN 20 UNIT/ML IV SOLN
INTRAVENOUS | Status: AC
  Filled 2017-11-12: qty 1

## 2017-11-12 MED ORDER — CEFAZOLIN SODIUM-DEXTROSE 2-4 GM/100ML-% IV SOLN: 2.0000 g | INTRAVENOUS | Status: DC

## 2017-11-12 MED ORDER — MIDAZOLAM HCL 2 MG/2ML IJ SOLN
INTRAMUSCULAR | Status: DC | PRN
  Administered 2017-11-12: 2 mg via INTRAVENOUS

## 2017-11-12 MED ORDER — SUGAMMADEX SODIUM 200 MG/2ML IV SOLN
INTRAVENOUS | Status: AC
  Filled 2017-11-12: qty 2

## 2017-11-12 MED ORDER — CEFAZOLIN SODIUM-DEXTROSE 2-4 GM/100ML-% IV SOLN
2.0000 g | INTRAVENOUS | Status: AC
  Administered 2017-11-12: 2 g via INTRAVENOUS

## 2017-11-12 MED ORDER — SCOPOLAMINE 1 MG/3DAYS TD PT72
MEDICATED_PATCH | TRANSDERMAL | Status: AC
  Administered 2017-11-12: 1.5 mg via TRANSDERMAL
  Filled 2017-11-12: qty 1

## 2017-11-12 MED ORDER — LACTATED RINGERS IV SOLN: INTRAVENOUS | Status: DC

## 2017-11-12 MED ORDER — VASOPRESSIN 20 UNIT/ML IV SOLN
INTRAVENOUS | Status: DC | PRN
  Administered 2017-11-12: 12 mL via INTRAMUSCULAR

## 2017-11-12 MED ORDER — ONDANSETRON HCL 4 MG PO TABS: 4.0000 mg | ORAL_TABLET | Freq: Four times a day (QID) | ORAL | Status: DC | PRN

## 2017-11-12 MED ORDER — ACETAMINOPHEN 10 MG/ML IV SOLN
INTRAVENOUS | Status: AC
  Filled 2017-11-12: qty 100

## 2017-11-12 MED ORDER — ONDANSETRON HCL 4 MG/2ML IJ SOLN
INTRAMUSCULAR | Status: DC | PRN
  Administered 2017-11-12: 4 mg via INTRAVENOUS

## 2017-11-12 MED ORDER — HYDROMORPHONE HCL 1 MG/ML IJ SOLN
INTRAMUSCULAR | Status: AC
  Filled 2017-11-12: qty 1

## 2017-11-12 MED ORDER — DIPHENHYDRAMINE HCL 12.5 MG/5ML PO ELIX: 12.5000 mg | ORAL_SOLUTION | Freq: Four times a day (QID) | ORAL | Status: DC | PRN

## 2017-11-12 MED ORDER — PROPOFOL 10 MG/ML IV BOLUS
INTRAVENOUS | Status: DC | PRN
  Administered 2017-11-12: 200 mg via INTRAVENOUS

## 2017-11-12 MED ORDER — ONDANSETRON HCL 4 MG/2ML IJ SOLN: 4.0000 mg | Freq: Four times a day (QID) | INTRAMUSCULAR | Status: DC | PRN

## 2017-11-12 MED ORDER — LACTATED RINGERS IV SOLN
INTRAVENOUS | Status: DC
  Administered 2017-11-12: 125 mL/h via INTRAVENOUS
  Administered 2017-11-12: 13:00:00 via INTRAVENOUS
  Administered 2017-11-13: 125 mL/h via INTRAVENOUS

## 2017-11-12 MED ORDER — LACTATED RINGERS IV SOLN
INTRAVENOUS | Status: DC
  Administered 2017-11-12 (×3): via INTRAVENOUS

## 2017-11-12 MED ORDER — DIPHENHYDRAMINE HCL 50 MG/ML IJ SOLN: 12.5000 mg | Freq: Four times a day (QID) | INTRAMUSCULAR | Status: DC | PRN

## 2017-11-12 MED ORDER — FENTANYL CITRATE (PF) 100 MCG/2ML IJ SOLN
INTRAMUSCULAR | Status: DC | PRN
  Administered 2017-11-12 (×4): 50 ug via INTRAVENOUS

## 2017-11-12 MED ORDER — FENTANYL CITRATE (PF) 100 MCG/2ML IJ SOLN: 25.0000 ug | INTRAMUSCULAR | Status: DC | PRN

## 2017-11-12 MED ORDER — CEFAZOLIN SODIUM-DEXTROSE 2-4 GM/100ML-% IV SOLN
INTRAVENOUS | Status: AC
  Filled 2017-11-12: qty 100

## 2017-11-12 MED ORDER — BUPIVACAINE-EPINEPHRINE 0.25% -1:200000 IJ SOLN
INTRAMUSCULAR | Status: DC | PRN
  Administered 2017-11-12: 18 mL

## 2017-11-12 MED ORDER — KETOROLAC TROMETHAMINE 30 MG/ML IJ SOLN
30.0000 mg | Freq: Four times a day (QID) | INTRAMUSCULAR | Status: DC
  Administered 2017-11-12 – 2017-11-13 (×3): 30 mg via INTRAVENOUS
  Filled 2017-11-12 (×4): qty 1

## 2017-11-12 MED ORDER — ROCURONIUM BROMIDE 100 MG/10ML IV SOLN
INTRAVENOUS | Status: DC | PRN
  Administered 2017-11-12: 10 mg via INTRAVENOUS
  Administered 2017-11-12: 5 mg via INTRAVENOUS
  Administered 2017-11-12: 50 mg via INTRAVENOUS

## 2017-11-12 MED ORDER — FENTANYL CITRATE (PF) 250 MCG/5ML IJ SOLN
INTRAMUSCULAR | Status: AC
  Filled 2017-11-12: qty 5

## 2017-11-12 MED ORDER — METOCLOPRAMIDE HCL 5 MG/ML IJ SOLN: 10.0000 mg | Freq: Once | INTRAMUSCULAR | Status: DC | PRN

## 2017-11-12 MED ORDER — ACETAMINOPHEN 325 MG PO TABS: 650.0000 mg | ORAL_TABLET | ORAL | Status: DC | PRN

## 2017-11-12 MED ORDER — LIDOCAINE HCL (CARDIAC) PF 100 MG/5ML IV SOSY
PREFILLED_SYRINGE | INTRAVENOUS | Status: DC | PRN
  Administered 2017-11-12: 80 mg via INTRAVENOUS

## 2017-11-12 MED ORDER — DEXAMETHASONE SODIUM PHOSPHATE 4 MG/ML IJ SOLN
INTRAMUSCULAR | Status: AC
  Filled 2017-11-12: qty 1

## 2017-11-12 MED ORDER — SODIUM CHLORIDE 0.9 % IJ SOLN
INTRAMUSCULAR | Status: AC
  Filled 2017-11-12: qty 100

## 2017-11-12 MED ORDER — BUPIVACAINE-EPINEPHRINE (PF) 0.25% -1:200000 IJ SOLN
INTRAMUSCULAR | Status: AC
  Filled 2017-11-12: qty 30

## 2017-11-12 MED ORDER — ROCURONIUM BROMIDE 100 MG/10ML IV SOLN
INTRAVENOUS | Status: AC
  Filled 2017-11-12: qty 1

## 2017-11-12 MED ORDER — DEXAMETHASONE SODIUM PHOSPHATE 4 MG/ML IJ SOLN
INTRAMUSCULAR | Status: DC | PRN
  Administered 2017-11-12: 4 mg via INTRAVENOUS

## 2017-11-12 MED ORDER — SCOPOLAMINE 1 MG/3DAYS TD PT72
1.0000 | MEDICATED_PATCH | Freq: Once | TRANSDERMAL | Status: DC
  Administered 2017-11-12: 1.5 mg via TRANSDERMAL

## 2017-11-12 MED ORDER — MEPERIDINE HCL 25 MG/ML IJ SOLN: 6.2500 mg | INTRAMUSCULAR | Status: DC | PRN

## 2017-11-12 MED ORDER — HYDROMORPHONE HCL 1 MG/ML IJ SOLN
INTRAMUSCULAR | Status: DC | PRN
  Administered 2017-11-12 (×2): 1 mg via INTRAVENOUS

## 2017-11-12 MED ORDER — NALOXONE HCL 0.4 MG/ML IJ SOLN: 0.4000 mg | INTRAMUSCULAR | Status: DC | PRN

## 2017-11-12 MED ORDER — MENTHOL 3 MG MT LOZG: 1.0000 | LOZENGE | OROMUCOSAL | Status: DC | PRN

## 2017-11-12 MED ORDER — ONDANSETRON HCL 4 MG/2ML IJ SOLN
INTRAMUSCULAR | Status: AC
  Filled 2017-11-12: qty 2

## 2017-11-12 MED ORDER — SODIUM CHLORIDE 0.9% FLUSH: 9.0000 mL | INTRAVENOUS | Status: DC | PRN

## 2017-11-12 MED ORDER — MIDAZOLAM HCL 2 MG/2ML IJ SOLN
INTRAMUSCULAR | Status: AC
  Filled 2017-11-12: qty 2

## 2017-11-12 SURGICAL SUPPLY — 42 items
CANISTER SUCT 3000ML PPV (MISCELLANEOUS) ×2 IMPLANT
CONT PATH 16OZ SNAP LID 3702 (MISCELLANEOUS) ×2 IMPLANT
DECANTER SPIKE VIAL GLASS SM (MISCELLANEOUS) ×2 IMPLANT
DRAPE CESAREAN BIRTH W POUCH (DRAPES) ×2 IMPLANT
DRSG OPSITE POSTOP 4X10 (GAUZE/BANDAGES/DRESSINGS) ×2 IMPLANT
DURAPREP 26ML APPLICATOR (WOUND CARE) ×2 IMPLANT
GAUZE SPONGE 4X4 16PLY XRAY LF (GAUZE/BANDAGES/DRESSINGS) ×4 IMPLANT
GLOVE BIO SURGEON STRL SZ7 (GLOVE) ×2 IMPLANT
GLOVE BIOGEL PI IND STRL 7.0 (GLOVE) ×2 IMPLANT
GLOVE BIOGEL PI INDICATOR 7.0 (GLOVE) ×2
GOWN STRL REUS W/TWL LRG LVL3 (GOWN DISPOSABLE) ×6 IMPLANT
NEEDLE HYPO 22GX1.5 SAFETY (NEEDLE) ×4 IMPLANT
NS IRRIG 1000ML POUR BTL (IV SOLUTION) ×2 IMPLANT
PACK ABDOMINAL GYN (CUSTOM PROCEDURE TRAY) ×2 IMPLANT
PAD OB MATERNITY 4.3X12.25 (PERSONAL CARE ITEMS) ×2 IMPLANT
PENCIL SMOKE EVAC W/HOLSTER (ELECTROSURGICAL) ×2 IMPLANT
PROTECTOR NERVE ULNAR (MISCELLANEOUS) ×2 IMPLANT
SEPRAFILM MEMBRANE 5X6 (MISCELLANEOUS) ×2 IMPLANT
STAPLER VISISTAT 35W (STAPLE) ×2 IMPLANT
SUT ETHIBOND 0 (SUTURE) ×2 IMPLANT
SUT MNCRL 0 MO-4 VIOLET 18 CR (SUTURE) ×1 IMPLANT
SUT MNCRL 0 VIOLET 6X18 (SUTURE) ×1 IMPLANT
SUT MNCRL+ AB 3-0 CT1 36 (SUTURE) ×2 IMPLANT
SUT MON AB 2-0 SH 27 (SUTURE) ×1
SUT MON AB 2-0 SH27 (SUTURE) ×1 IMPLANT
SUT MON AB 4-0 PS1 27 (SUTURE) ×4 IMPLANT
SUT MON AB-0 CT1 36 (SUTURE) ×6 IMPLANT
SUT MONOCRYL 0 6X18 (SUTURE) ×1
SUT MONOCRYL 0 MO 4 18  CR/8 (SUTURE) ×1
SUT MONOCRYL AB 3-0 CT1 36IN (SUTURE) ×2
SUT VIC AB 0 CT1 18XCR BRD8 (SUTURE) ×1 IMPLANT
SUT VIC AB 0 CT1 27 (SUTURE) ×6
SUT VIC AB 0 CT1 27XBRD ANBCTR (SUTURE) ×6 IMPLANT
SUT VIC AB 0 CT1 8-18 (SUTURE) ×1
SUT VIC AB 1 CTX 36 (SUTURE)
SUT VIC AB 1 CTX36XBRD ANBCTRL (SUTURE) IMPLANT
SUT VIC AB 3-0 SH 27 (SUTURE)
SUT VIC AB 3-0 SH 27X BRD (SUTURE) IMPLANT
SUT VICRYL 0 TIES 12 18 (SUTURE) ×2 IMPLANT
SYR CONTROL 10ML LL (SYRINGE) ×4 IMPLANT
TOWEL OR 17X24 6PK STRL BLUE (TOWEL DISPOSABLE) ×4 IMPLANT
TRAY FOLEY W/BAG SLVR 14FR (SET/KITS/TRAYS/PACK) ×2 IMPLANT

## 2017-11-12 NOTE — Op Note (Signed)
PATIENT:  Cynthia Weber  42 y.o. female  PRE-OPERATIVE DIAGNOSIS:  Fibroid Menorrhagis  POST-OPERATIVE DIAGNOSIS:  fibroids, menorrhagia  PROCEDURE:  Procedure(s): ABDOMINAL MYOMECTOMY WITH RIGHT TUBAL CYSTECTOMY (N/A) ASSISTING WITH CLOSURE OF ABDOMINAL INCISION AFTER FIBROIDECTOMY (N/A)  SURGEON:  Surgeon(s) and Role: Panel 1:    * Guss Bunde, MD - Primary    * Dillingham, Loel Lofty, DO - Assist and clousre.    Hulan Fray, Juliene Pina, MD  ANESTHESIA:   general  EBL:  500 mL   BLOOD ADMINISTERED:none  DRAINS: none   LOCAL MEDICATIONS USED:  MARCAINE     SPECIMEN:  Source of Specimen:  uterine myoma and right paratubal cyst.  DISPOSITION OF SPECIMEN:  PATHOLOGY (benign frozen section)  COUNTS:  YES  TOURNIQUET:  * No tourniquets in log *  DICTATION: .Note written in EPIC  PLAN OF CARE: Admit to inpatient   PATIENT DISPOSITION:  PACU - hemodynamically stable.  NDICATIONS: 42 yo G1P1 here for abdominal myomectomy for symptomatic uterine leiomyomata.  Risks of surgery were discussed with the patient including but not limited to: bleeding which may require transfusion or reoperation and hysterectomy (3-4% risk of intraoperative conversion to hysterectomy); infection which may require antibiotics; injury to bowel, bladder, ureters or other surrounding organs; need for additional procedures; formation of intraperitoneal adhesions that may lead to other complications or make future surgeries more difficult;  thromboembolic phenomenon, incisional problems and other postoperative/anesthesia complications.  Also discussed possible need for cesarean deliveries at 51 - [redacted] weeks gestation for subsequent pregnancies if the endometrial cavity is breached and possible recurrence of fibroids; 25% of women who undergo this procedure require further treatment. Written informed consent was obtained.    FINDINGS:  Uterine leiomyomata approx 7 cm diameter. Endometrial cavity was  breached.  PROCEDURE IN DETAIL:  The patient received IV preoperative antibiotics approximately 30 minutes prior to procedure.  She was then taken to the operating room and general anesthesia was administered without difficulty and SCDs were placed on the lower extremities.   She was placed in dorsal supine position and prepped and draped in a sterile manner.  A Foley catheter was inserted into the bladder and attached to constant drainage.  After an adequate timeout was performed, attention was then turned to the abdomen where a Pfannenstiel incision was made with a scalpel by Dr. Marla Roe.  Patient has history of large abdominoplasty and at risk for scar tissue and hernia formation.    This incision was carried down to the fascia using electrocautery.  The fascia was incised in the midline and this incision was extended bilaterally using the Mayo scissors.  Kocher's were applied to the superior aspect of the fascial incision, and the rectus muscles were dissected off bluntly and sharply using the Mayo scissors and caudery.  A similar process was carried out at the inferior aspect of the incision.  The rectus muscles were separated in the midline bluntly and the peritoneum was entered bluntly, with good visualization of the bladder and bowel.  Attention was then turned to the uterus where the myoma was noted.  The uterus was then delivered up through the incision.  Vasopressin solution was injected over the surface of the leiomyoma to aid with hemostasis.   A vertical incision was made over the uterine leiomyoma into the leiomyoma, and the capsule was recognized.  Using blunt and sharp dissection as well as caudery, the leiomyoma was freed from the surrounding myometrial tissue and removed intact. The capsure as  uneven and fibroid was sifficult to remove.  Frozen section returned probably benign leiomyoma.  There were large vessels at the abase of the fibroid.  These were suture ligated with 0-Vicryl.  There  was a small hematoma on the right broad ligament.  This area was observed for 5 minutes nadnot3d to be stable and not growing.  The incision was closed in multiple layers, using 0 Monocryl running interlocking stitches, and the serosa was reapproximated using a 2-0 Monocryl baseball stitch.  Overall good hemostasis was noted.  Arista was place over the incision and Seprafilm was also used to help preven scar tissue.  The uterus was returned to the abdomen. There was a right paratubal cyst on a thin stalk which was removed with cautery and sent to pathology. The peritoneum was closed using 0 Vicryl running stitch.    At this point Dr. Marla Roe took over the closure.  The rectus muscles were re-approximated.  The fascia was reapproximated with 0 Vicryl running stitch.  The head of the bed was elevated 35 degrees.  The subcutaneous layer was reapproximated with interrupted 3-0 Monocryl stitches.  The skin was closed with 4-0 Monocryl in a subcuticular fashion.  Derma bond was placed over the incision.  The patient tolerated the procedure well. There were no complications during this case and good hemostasis was noted before closing the peritoneal cavity.  Sponge, lap, needle and instrument counts were correct x 3.  The patient was taken to the recovery room extubated and in stable condition.

## 2017-11-12 NOTE — Brief Op Note (Signed)
11/12/2017  10:38 AM  PATIENT:  Cynthia Weber  42 y.o. female  PRE-OPERATIVE DIAGNOSIS:  Fibroid Menorrhagis  POST-OPERATIVE DIAGNOSIS:  fibroids, menorrhagia  PROCEDURE:  Procedure(s): ABDOMINAL MYOMECTOMY WITH RIGHT TUBAL CYSTECTOMY (N/A) ASSISTING WITH CLOSURE OF ABDOMINAL INCISION AFTER FIBROIDECTOMY (N/A)  SURGEON:  Surgeon(s) and Role: Panel 1:    * Guss Bunde, MD - Primary    * Dillingham, Loel Lofty, DO - Assist and clousre.    Hulan Fray, Juliene Pina, MD  ANESTHESIA:   general  EBL:  500 mL   BLOOD ADMINISTERED:none  DRAINS: none   LOCAL MEDICATIONS USED:  MARCAINE     SPECIMEN:  Source of Specimen:  uterine myoma and right paratubal cyst.  DISPOSITION OF SPECIMEN:  PATHOLOGY  COUNTS:  YES  TOURNIQUET:  * No tourniquets in log *  DICTATION: .Note written in EPIC  PLAN OF CARE: Admit to inpatient   PATIENT DISPOSITION:  PACU - hemodynamically stable.   Delay start of Pharmacological VTE agent (>24hrs) due to surgical blood loss or risk of bleeding: yes

## 2017-11-12 NOTE — Anesthesia Preprocedure Evaluation (Signed)
Anesthesia Evaluation  Patient identified by MRN, date of birth, ID band Patient awake    Reviewed: Allergy & Precautions, NPO status , Patient's Chart, lab work & pertinent test results  Airway Mallampati: II  TM Distance: >3 FB Neck ROM: Full    Dental no notable dental hx.    Pulmonary neg pulmonary ROS,    Pulmonary exam normal breath sounds clear to auscultation       Cardiovascular negative cardio ROS Normal cardiovascular exam Rhythm:Regular Rate:Normal     Neuro/Psych negative neurological ROS  negative psych ROS   GI/Hepatic negative GI ROS, Neg liver ROS,   Endo/Other  negative endocrine ROS  Renal/GU negative Renal ROS  negative genitourinary   Musculoskeletal negative musculoskeletal ROS (+)   Abdominal   Peds negative pediatric ROS (+)  Hematology negative hematology ROS (+)   Anesthesia Other Findings   Reproductive/Obstetrics negative OB ROS                             Anesthesia Physical Anesthesia Plan  ASA: I  Anesthesia Plan: General   Post-op Pain Management:    Induction: Intravenous  PONV Risk Score and Plan: 4 or greater and Ondansetron, Dexamethasone, Midazolam, Scopolamine patch - Pre-op and Treatment may vary due to age or medical condition  Airway Management Planned: Oral ETT  Additional Equipment:   Intra-op Plan:   Post-operative Plan: Extubation in OR  Informed Consent: I have reviewed the patients History and Physical, chart, labs and discussed the procedure including the risks, benefits and alternatives for the proposed anesthesia with the patient or authorized representative who has indicated his/her understanding and acceptance.   Dental advisory given  Plan Discussed with: CRNA  Anesthesia Plan Comments:         Anesthesia Quick Evaluation

## 2017-11-12 NOTE — Progress Notes (Signed)
Pt Sleeping

## 2017-11-12 NOTE — H&P (Signed)
Cynthia Weber is an 42 y.o. female with painful heavy menses that keep her from participating in her activities of daily living.  Pt has large fibroid that she would like to have removed to control her symptoms.  Pt is in a new relationship and would like to retain child bearing ability.  Pt refuses IUD, medications, and [place]. Pt has history of abdominoplasty with scarring, pulling, and distortion of her abdominal wall.  I discussed case with Dr. Audelia Hives who subsequently examined the patient.   The patient is at risk for hernia formation during dissection.  Dr. Janalyn Shy will  Help with opening an d closing to prevent damage to abdominal wall.    Pertinent Gynecological History: Menses: with severe dysmenorrhea and heavy Bleeding: heavy Contraception: none DES exposure: denies Blood transfusions: none Sexually transmitted diseases: none Previous GYN Procedures: vaginal delivery  Last mammogram: not done yet  Last pap: normal co testing in 2018 OB History: G1, P1001   Menstrual History: Patient's last menstrual period was 10/28/2017 (exact date).   Biopsy PROLIFERATIVE PHASE ENDOMETRIUM ASSOCIATED WITH SMALL POLYPOID FRAGMENTS. - NO ATYPIA OR MALIGNANCY IDENTIFIED Past Medical History:  Diagnosis Date  . Anemia   . Fibroid, uterine   . Fibroids   . SVD (spontaneous vaginal delivery) 2000   x 1    Past Surgical History:  Procedure Laterality Date  . dental implant     lower back right  . SINUS SURGERY WITH INSTATRAK  02/27/2017   Dr Benjamine Mola -[facility]  . tummy tuck  2014   [facility]. Jiah Coup [facility]  . WISDOM TOOTH EXTRACTION      Family History  Problem Relation Age of Onset  . Thyroid cancer Mother   . Heart attack Father     Social History:  reports that she has never smoked. She has never used smokeless tobacco. She reports that she does not drink alcohol or use drugs.  Allergies: No Known Allergies  No medications prior to admission.   CBC    Component Value  Date/Time   WBC 6.9 11/09/2017 1332   RBC 4.48 11/09/2017 1332   HGB 14.8 11/09/2017 1332   HCT 41.8 11/09/2017 1332   PLT 276 11/09/2017 1332   MCV 93.3 11/09/2017 1332   MCH 33.0 11/09/2017 1332   MCHC 35.4 11/09/2017 1332   RDW 12.3 11/09/2017 1332   LYMPHSABS 1,806 06/01/2017 1439   EOSABS 79 06/01/2017 1439   BASOSABS 43 06/01/2017 1439   UPT negative  Review of Systems  Constitutional: Negative.   Respiratory: Negative.   Cardiovascular: Negative.   Gastrointestinal: Negative.   Genitourinary:       Menorrhagia, dysmenorrhea  Neurological: Negative.   Psychiatric/Behavioral: Negative.     Last menstrual period 10/28/2017. Physical Exam  Vitals reviewed. Constitutional: She is oriented to person, place, and time. She appears well-developed and well-nourished. No distress.  HENT:  Head: Normocephalic and atraumatic.  Eyes: Conjunctivae are normal.  Neck: Neck supple. No thyromegaly present.  Cardiovascular: Normal rate and regular rhythm.  Respiratory: Effort normal and breath sounds normal.  GI: Soft. There is no tenderness. There is no rebound and no guarding.  Musculoskeletal: Normal range of motion. She exhibits no tenderness.  Neurological: She is alert and oriented to person, place, and time.  Skin: Skin is warm and dry.  Psychiatric: She has a normal mood and affect.   Vitals:   11/12/17 0618  BP: 115/88  Pulse: 89  Resp: 16  Temp: 97.8 F (36.6  C)  TempSrc: Oral  SpO2: 100%    Assessment/Plan: 42 year old with enlarge uterus from fibroid.  Negative endometrial biopsy.  Pt wants myomectomy to help with pain, bulk, nad menorrhagia.  Pt wants to retain chid bearing potential.  Pt understands she may have difficulty bedoming pregnant due to her age and that she will need an early c-section due to the uterine surgery.   Pt understands that life threatening bleeding can occur during a myomectomy and result in a hysterectomy.  Patient consented for  Myomectomy.  Risks include but not limited to bleeding requiring a blood transfusion, infection, damage to intraabdominal organs (bowel, bladder, ureters), DVT, pulmonary embolism, anesthetic complications, scarring on abdomen resulting in aesthetic dissatisfaction, and hysterectomy.    Silas Sacramento, MD, [facility] Chief Clinical Transformation Officer [place]   11/12/2017, 5:31 AM

## 2017-11-12 NOTE — Anesthesia Postprocedure Evaluation (Signed)
Anesthesia Post Note  Patient: Demeka Sutter Nunes  Procedure(s) Performed: ABDOMINAL MYOMECTOMY WITH RIGHT TUBAL CYSTECTOMY (N/A Abdomen) ASSISTING WITH CLOSURE OF ABDOMINAL INCISION AFTER FIBROIDECTOMY (N/A Abdomen)     Patient location during evaluation: PACU Anesthesia Type: General Level of consciousness: awake and alert Pain management: pain level controlled Vital Signs Assessment: post-procedure vital signs reviewed and stable Respiratory status: spontaneous breathing, nonlabored ventilation, respiratory function stable and patient connected to nasal cannula oxygen Cardiovascular status: blood pressure returned to baseline and stable Postop Assessment: no apparent nausea or vomiting Anesthetic complications: no    Last Vitals:  Vitals:   11/12/17 1145 11/12/17 1159  BP:  121/77  Pulse: 74 72  Resp: 16 16  Temp:  36.7 C  SpO2: 100%     Last Pain:  Vitals:   11/12/17 1159  TempSrc: Oral  PainSc:    Pain Goal: Patients Stated Pain Goal: 3 (11/12/17 1130)               Montez Hageman

## 2017-11-12 NOTE — Transfer of Care (Signed)
Immediate Anesthesia Transfer of Care Note  Patient: Cynthia Weber  Procedure(s) Performed: ABDOMINAL MYOMECTOMY WITH RIGHT TUBAL CYSTECTOMY (N/A Abdomen) ASSISTING WITH CLOSURE OF ABDOMINAL INCISION AFTER FIBROIDECTOMY (N/A Abdomen)  Patient Location: PACU  Anesthesia Type:General  Level of Consciousness: awake, alert  and oriented  Airway & Oxygen Therapy: Patient Spontanous Breathing and Patient connected to nasal cannula oxygen  Post-op Assessment: Report given to RN and Post -op Vital signs reviewed and stable  Post vital signs: Reviewed and stable  Last Vitals:  Vitals Value Taken Time  BP 136/73 11/12/2017 10:21 AM  Temp    Pulse 108 11/12/2017 10:26 AM  Resp 22 11/12/2017 10:26 AM  SpO2 98 % 11/12/2017 10:26 AM  Vitals shown include unvalidated device data.  Last Pain:  Vitals:   11/12/17 0618  TempSrc: Oral      Patients Stated Pain Goal: 3 (81/01/75 1025)  Complications: No apparent anesthesia complications HR 852, RR 12, SaO2 98%, BP 136/73

## 2017-11-12 NOTE — Op Note (Signed)
DATE OF OPERATION: 11/12/2017  LOCATION: [facility] Operating Room  PREOPERATIVE DIAGNOSIS: Uterine fibroma  POSTOPERATIVE DIAGNOSIS: Same  PROCEDURE: Abdominal myomectomy with right tubal cystectomy - Dr. Gala Romney Closure of the 8 cm abdominal incision - Dr. Marla Roe  SURGEON: Silas Sacramento  ASSISTANT: Lyndee Leo Sanger Sephiroth Mcluckie, DO  EBL: 500 cc  CONDITION: Stable  COMPLICATIONS: None  INDICATION: The patient, Cynthia Weber, is a 42 y.o. female born on [birth year], is here for treatment of a symptomatic uterine leiomyomata.  The patient had an abdominoplasty in the past.  There was concern about the approach and closure of the incision.  I was asked to assist in the case and provide closure.   PROCEDURE DETAILS:  The patient was seen prior to surgery and marked.  The IV antibiotics were given. The patient was taken to the operating room and given a general anesthetic. A standard time out was performed and all information was confirmed by those in the room. SCDs were placed.   The patient was prepped and draped.  The approach to the procedure was done by Dr. Gala Romney and the procedure completed.  I assisted throughout the case with retraction, blood control, suturing and needed assistance.  Once the fibroid was out and the uterus closed.  The rectus muscles were re-approximated and the fascia sutures with the 0 Vicryl.  The deep layers of the subcutaneous tissue were closed with interrupted 4-0 Monocryl.  The next layer closed with the 4-0 Monocryl.  The skin was closed with the 4-0 Monocryl running subcutaneous stitch.  The counts were correct.  The derma bond was applied with the sterile dressing.  The patient was allowed to wake up and taken to recovery room in stable condition at the end of the case. The family was notified at the end of the case.

## 2017-11-12 NOTE — Anesthesia Procedure Notes (Signed)
Procedure Name: Intubation Date/Time: 11/12/2017 7:35 AM Performed by: Elenore Paddy, CRNA Pre-anesthesia Checklist: Patient identified, Emergency Drugs available, Suction available and Patient being monitored Patient Re-evaluated:Patient Re-evaluated prior to induction Oxygen Delivery Method: Circle system utilized Preoxygenation: Pre-oxygenation with 100% oxygen Induction Type: IV induction Ventilation: Mask ventilation without difficulty Laryngoscope Size: Mac and 3 Grade View: Grade I Tube type: Oral Number of attempts: 1 Airway Equipment and Method: Stylet Placement Confirmation: ETT inserted through vocal cords under direct vision,  positive ETCO2 and breath sounds checked- equal and bilateral Secured at: 20 cm Tube secured with: Tape Dental Injury: Teeth and Oropharynx as per pre-operative assessment

## 2017-11-12 NOTE — Anesthesia Postprocedure Evaluation (Signed)
Anesthesia Post Note  Patient: Cynthia Weber  Procedure(s) Performed: ABDOMINAL MYOMECTOMY WITH RIGHT TUBAL CYSTECTOMY (N/A Abdomen) ASSISTING WITH CLOSURE OF ABDOMINAL INCISION AFTER FIBROIDECTOMY (N/A Abdomen)     Patient location during evaluation: [facility] Anesthesia Type: General Level of consciousness: awake and alert Pain management: pain level controlled Vital Signs Assessment: post-procedure vital signs reviewed and stable Respiratory status: spontaneous breathing Cardiovascular status: stable Postop Assessment: no headache, patient able to bend at knees, no backache, no apparent nausea or vomiting and adequate PO intake Anesthetic complications: no    Last Vitals:  Vitals:   11/12/17 1519 11/12/17 1648  BP: (!) 107/52   Pulse: 66   Resp: 16 15  Temp: 36.8 C   SpO2: 100%     Last Pain:  Vitals:   11/12/17 1700  TempSrc:   PainSc: 3    Pain Goal: Patients Stated Pain Goal: 3 (11/12/17 1200)               Birdena Crandall, Velvet Bathe

## 2017-11-13 ENCOUNTER — Encounter (HOSPITAL_COMMUNITY): Payer: Self-pay | Admitting: Obstetrics & Gynecology

## 2017-11-13 LAB — CBC
HCT: 34.5 % — ABNORMAL LOW (ref 36.0–46.0)
HEMOGLOBIN: 11.9 g/dL — AB (ref 12.0–15.0)
MCH: 32.2 pg (ref 26.0–34.0)
MCHC: 34.5 g/dL (ref 30.0–36.0)
MCV: 93.5 fL (ref 78.0–100.0)
Platelets: 218 10*3/uL (ref 150–400)
RBC: 3.69 MIL/uL — ABNORMAL LOW (ref 3.87–5.11)
RDW: 12.3 % (ref 11.5–15.5)
WBC: 12 10*3/uL — ABNORMAL HIGH (ref 4.0–10.5)

## 2017-11-13 MED ORDER — OXYCODONE-ACETAMINOPHEN 5-325 MG PO TABS
1.0000 | ORAL_TABLET | ORAL | Status: DC | PRN
  Administered 2017-11-13 – 2017-11-14 (×3): 1 via ORAL
  Filled 2017-11-13 (×3): qty 1

## 2017-11-13 NOTE — Plan of Care (Signed)
  Problem: Pain Managment: Goal: General experience of comfort will improve Outcome: Progressing   Problem: Pain Goal: Pain control Description Patient will demonstrate personal actions to control pain.   Outcome: Progressing

## 2017-11-13 NOTE — Progress Notes (Signed)
1 Day Post-Op Procedure(s) (LRB): ABDOMINAL MYOMECTOMY WITH RIGHT TUBAL CYSTECTOMY (N/A) ASSISTING WITH CLOSURE OF ABDOMINAL INCISION AFTER FIBROIDECTOMY (N/A)  Subjective: Patient reports nausea.  Pt did not vomit.  Only drank clears.  +ambulation.  No flatus  Objective: I have reviewed patient's vital signs, intake and output, medications and labs.  General: alert, cooperative and no distress Resp: clear to auscultation bilaterally Cardio: regular rate and rhythm GI: soft, appropriately tender, dressing is c/d/i Extremities: Homans sign is negative, no sign of DVT Vaginal Bleeding: minimal  CBC Latest Ref Rng & Units 11/13/2017 11/12/2017 11/12/2017  WBC 4.0 - 10.5 K/uL 12.0(H) 17.2(H) 22.8(H)  Hemoglobin 12.0 - 15.0 g/dL 11.9(L) 12.8 13.3  Hematocrit 36.0 - 46.0 % 34.5(L) 36.1 38.5  Platelets 150 - 400 K/uL 218 230 266   Assessment: s/p Procedure(s): ABDOMINAL MYOMECTOMY WITH RIGHT TUBAL CYSTECTOMY (N/A) ASSISTING WITH CLOSURE OF ABDOMINAL INCISION AFTER FIBROIDECTOMY (N/A): stable  Plan: Advance diet Encourage ambulation Advance to PO medication Discontinue IV fluids  LOS: 1 day    Silas Sacramento 11/13/2017, 10:10 AM

## 2017-11-14 MED ORDER — FERROUS SULFATE 325 (65 FE) MG PO TBEC: 325.0000 mg | DELAYED_RELEASE_TABLET | Freq: Every day | ORAL | 3 refills | Status: AC

## 2017-11-14 MED ORDER — IBUPROFEN 600 MG PO TABS: 600.0000 mg | ORAL_TABLET | Freq: Four times a day (QID) | ORAL | 0 refills | Status: DC | PRN

## 2017-11-14 MED ORDER — DOCUSATE SODIUM 100 MG PO CAPS: 100.0000 mg | ORAL_CAPSULE | Freq: Two times a day (BID) | ORAL | 2 refills | Status: DC | PRN

## 2017-11-14 MED ORDER — OXYCODONE-ACETAMINOPHEN 5-325 MG PO TABS: 1.0000 | ORAL_TABLET | ORAL | 0 refills | Status: DC | PRN

## 2017-11-14 NOTE — Discharge Instructions (Signed)
Myomectomy, Care After °Refer to this sheet in the next few weeks. These instructions provide you with information on caring for yourself after your procedure. Your health care provider may also give you more specific instructions. Your treatment has been planned according to current medical practices, but problems sometimes occur. Call your health care provider if you have any problems or questions after your procedure. °What can I expect after the procedure? °After your procedure, it is typical to have the following: °· Pain in your abdomen, especially at any incision sites. You will be given pain medicine to control the pain. °· Tiredness. This is a normal part of the recovery process. Your energy level will return to normal over the next several weeks. °· Constipation. °· Vaginal bleeding. This is normal and should stop after 1-2 weeks. ° °Follow these instructions at home: °· Only take over-the-counter or prescription medicines as directed by your health care provider. Avoid aspirin because it can cause bleeding. °· Do not douche, use tampons, or have sexual intercourse until given permission by your health care provider. °· Remove or change any bandages (dressings) as directed by your health care provider. °· Take showers instead of baths as directed by your health care provider. °· You will probably be able to go back to your normal routine after a few days. Do not do anything that requires extra effort until your health care provider says it is okay. Do not lift anything heavier than 15 pounds (6.8 kg) until your health care provider approves. °· Walk daily but take frequent rest breaks if you tire easily. °· Continue to practice deep breathing and coughing. If it hurts to cough, try holding a pillow against your belly as you cough. °· If you become constipated, you may: °? Use a mild laxative if your health care provider approves. °? Add more fruit and bran to your diet. °? Drink enough fluids to keep your  urine clear or pale yellow. °· Take your temperature twice a day and write it down. °· Do not drink alcohol. °· Do not drive until your health care provider approves. °· Have someone help you at home for 1 week or until you can do your own household activities. °· Follow up with your health care provider as directed. °Contact a health care provider if: °· You have a fever. °· You have increasing abdominal pain that is not relieved with medicine. °· You have nausea, vomiting, or diarrhea. °· You have pain when you urinate, or you have blood in your urine. °· You have a rash on your body. °· You have pain or redness where your IV access tube was inserted. °· You have redness, swelling, or any kind of drainage from an incision. °Get help right away if: °· You have weakness or lightheadedness. °· You have pain, swelling, or redness in your legs. °· You have chest pain. °· You faint. °· You have shortness of breath. °· You have heavy vaginal bleeding. °· Your incision is opening up. °This information is not intended to replace advice given to you by your health care provider. Make sure you discuss any questions you have with your health care provider. °Document Released: 12/04/2010 Document Revised: 12/20/2015 Document Reviewed: 02/23/2013 °Elsevier Interactive Patient Education © 2017 [employer] ° °

## 2017-11-14 NOTE — Discharge Summary (Signed)
Physician Discharge Summary  Patient ID: Cynthia Weber MRN: [id number] DOB/AGE: 1976-03-11 42 y.o.  Admit date: 11/12/2017 Discharge date: 11/14/2017  Admission Diagnoses: S/p myomectomy  Discharge Diagnoses:  Active Problems:   S/P myomectomy   Discharged Condition: good  Hospital Course: Patient was admitted after abdominal myomectomy with right tubal cystectomy with abdominal closure by plastic surgery.  Patient was hospitalized for 2 days following the procedure and had an uncomplicated postoperative course.  Patient has increase ambulation and diet.  She is being sent home today to follow-up 4 to 6 weeks outpatient.  Consults: None  Significant Diagnostic Studies: none  Treatments: analgesia: percocet  Discharge Exam: Blood pressure 107/76, pulse 86, temperature 98.8 F (37.1 C), temperature source Oral, resp. rate 18, height 5\' 3"  (1.6 m), weight 154 lb (69.9 kg), last menstrual period 10/28/2017, SpO2 98 %. General appearance: alert, cooperative and no distress Resp: no respiratory distress Cardio: regular rate and rhythm, S1, S2 normal, no murmur, click, rub or gallop GI: soft, non-tender; bowel sounds normal; no masses,  no organomegaly and incision clean, dry, intact Extremities: extremities normal, atraumatic, no cyanosis or edema, Homans sign is negative, no sign of DVT and no edema, redness or tenderness in the calves or thighs Pulses: 2+ and symmetric Skin: Skin color, texture, turgor normal. No rashes or lesions  Disposition: Discharge disposition: 01-Home or Self Care       Discharge Instructions     Remove dressing in 72 hours   Complete by:  As directed    Call MD for:  difficulty breathing, headache or visual disturbances   Complete by:  As directed    Call MD for:  hives   Complete by:  As directed    Call MD for:  persistant dizziness or light-headedness   Complete by:  As directed    Call MD for:  persistant nausea and vomiting   Complete  by:  As directed    Call MD for:  redness, tenderness, or signs of infection (pain, swelling, redness, odor or green/yellow discharge around incision site)   Complete by:  As directed    Call MD for:  severe uncontrolled pain   Complete by:  As directed    Call MD for:  temperature >100.4   Complete by:  As directed    Diet - low sodium heart healthy   Complete by:  As directed    Increase activity slowly   Complete by:  As directed    Sexual Activity Restrictions   Complete by:  As directed    No sex until post-op appointment     Allergies as of 11/14/2017   No Known Allergies     Medication List    TAKE these medications   cholecalciferol 1000 units tablet Commonly known as:  VITAMIN D Take 1,000 Units by mouth daily.   docusate sodium 100 MG capsule Commonly known as:  COLACE Take 1 capsule (100 mg total) by mouth 2 (two) times daily as needed.   ferrous sulfate 325 (65 FE) MG EC tablet Take 1 tablet (325 mg total) by mouth daily.   ibuprofen 600 MG tablet Commonly known as:  ADVIL,MOTRIN Take 1 tablet (600 mg total) by mouth every 6 (six) hours as needed.   oxyCODONE-acetaminophen 5-325 MG tablet Commonly known as:  PERCOCET/ROXICET Take 1 tablet by mouth every 4 (four) hours as needed for moderate pain.      Follow-up [place] for [facility] at [place]. Schedule  an appointment as soon as possible for a visit in 4 week(s).   Specialty:  Obstetrics and Gynecology Contact information: [place], [place] [phone]         35 minutes spent on discharging patient, including examination and coordination of care.  Signed: Truett Mainland 11/14/2017, 11:00 AM

## 2017-11-14 NOTE — Progress Notes (Signed)
Discharge instructions given and patient verbalized understanding of all instructions given.Written copy of AVS given to patient.

## 2017-11-14 NOTE — Progress Notes (Signed)
Discharged home via w/c in stable condition. 

## 2017-11-17 ENCOUNTER — Encounter: Payer: Self-pay | Admitting: *Deleted

## 2017-12-09 ENCOUNTER — Encounter: Payer: Self-pay | Admitting: Obstetrics & Gynecology

## 2017-12-09 ENCOUNTER — Ambulatory Visit (INDEPENDENT_AMBULATORY_CARE_PROVIDER_SITE_OTHER): Payer: BLUE CROSS/BLUE SHIELD | Admitting: Obstetrics & Gynecology

## 2017-12-09 VITALS — BP 110/70 | HR 91 | Temp 97.9°F | Resp 18 | Wt 150.8 lb

## 2017-12-09 DIAGNOSIS — Z9889 Other specified postprocedural states: Secondary | ICD-10-CM

## 2017-12-09 DIAGNOSIS — Z3009 Encounter for other general counseling and advice on contraception: Secondary | ICD-10-CM

## 2017-12-09 NOTE — Progress Notes (Signed)
   Subjective:    Patient ID: Cynthia Weber, female    DOB: 1976-05-19, 42 y.o.   MRN: [id number]  HPI  Pt presents post op from abdominal myomectomy.  Doing well.  Driving and increasing activity .    Review of Systems  Constitutional: Negative.   Respiratory: Negative.   Cardiovascular: Negative.   Gastrointestinal: Negative.   Genitourinary: Negative.   Musculoskeletal: Negative.   Neurological: Negative.   Psychiatric/Behavioral: Negative.        Objective:   Physical Exam  Constitutional: She is oriented to person, place, and time. She appears well-developed and well-nourished. No distress.  HENT:  Head: Normocephalic and atraumatic.  Eyes: Conjunctivae are normal.  Cardiovascular: Normal rate.  Pulmonary/Chest: Effort normal.  Abdominal: Soft. Bowel sounds are normal. She exhibits no distension and no mass. There is no tenderness. There is no rebound and no guarding.  Musculoskeletal: She exhibits no edema.  Neurological: She is alert and oriented to person, place, and time.  Skin: Skin is warm and dry.  Psychiatric: She has a normal mood and affect.  Vitals reviewed.  Vitals:   12/09/17 1059  BP: 110/70  Pulse: 91  Resp: 18  Temp: 97.9 F (36.6 C)  TempSrc: Oral  Weight: 150 lb 12.8 oz (68.4 kg)   Assessment & Plan:  42 yo female s/p abdominal myomectomy.  1.  Increase activity slowly. 2. OK to go [facility] 3.  Pt is fertile and understands she can conceive.  Pt will use condoms or natural family planing.  Pregnancy within the year could increase chance of rupture.   15 minutes spent face to face with patient with >50% counseling about menstrual cycle expectations and fertility.

## 2017-12-12 ENCOUNTER — Encounter: Payer: Self-pay | Admitting: Obstetrics & Gynecology

## 2017-12-25 ENCOUNTER — Encounter: Payer: Self-pay | Admitting: Osteopathic Medicine

## 2017-12-25 ENCOUNTER — Ambulatory Visit (INDEPENDENT_AMBULATORY_CARE_PROVIDER_SITE_OTHER): Payer: BLUE CROSS/BLUE SHIELD | Admitting: Osteopathic Medicine

## 2017-12-25 VITALS — BP 123/77 | HR 86 | Temp 98.0°F | Wt 150.6 lb

## 2017-12-25 DIAGNOSIS — B379 Candidiasis, unspecified: Secondary | ICD-10-CM

## 2017-12-25 DIAGNOSIS — B009 Herpesviral infection, unspecified: Secondary | ICD-10-CM | POA: Diagnosis not present

## 2017-12-25 LAB — WET PREP FOR TRICH, YEAST, CLUE
MICRO NUMBER:: 90657637
SPECIMEN QUALITY 3963: ADEQUATE

## 2017-12-25 MED ORDER — VALACYCLOVIR HCL 1 G PO TABS: 1000.0000 mg | ORAL_TABLET | Freq: Three times a day (TID) | ORAL | 1 refills | Status: DC

## 2017-12-25 MED ORDER — FLUCONAZOLE 150 MG PO TABS: 150.0000 mg | ORAL_TABLET | Freq: Once | ORAL | 1 refills | Status: AC

## 2017-12-25 MED ORDER — LIDOCAINE 5 % EX OINT: 1.0000 "application " | TOPICAL_OINTMENT | Freq: Four times a day (QID) | CUTANEOUS | 1 refills | Status: DC | PRN

## 2017-12-25 NOTE — Progress Notes (Signed)
HPI: Cynthia Weber is a 42 y.o. female who  has a past medical history of Anemia, Fibroid, uterine, Fibroids, and SVD (spontaneous vaginal delivery) (2000).  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 Has a blistering rash on her back at this point, previously told t this might be shingles, never treated.  Recent myomectomy, recovering okay. Concern for vaginal yeast infection due to itching. No blistering lesions on vaginal area    Past medical history, surgical history, and family history reviewed.  Current medication list and allergy/intolerance information reviewed.   (See remainder of HPI, ROS, Phys Exam below)    ASSESSMENT/PLAN:   Yeast infection - no vaginal lesions, discharge consistent with yeast infection. not sure how recent myomectomy may be related, but possible altered discharge. f/u OBGYN prn - Plan: WET PREP FOR [place], YEAST, CLUE  Herpes simplex infection of skin - questionable herpes versus zoster, advised on possible contagious nature of this. We'll treat with antivirals, pain control with topical - Plan: Viral culture   Meds ordered this encounter  Medications  . valACYclovir (VALTREX) 1000 MG tablet    Sig: Take 1 tablet (1,000 mg total) by mouth 3 (three) times daily for 7 days. Start at first sign of rash outbreak    Dispense:  21 tablet    Refill:  1  . fluconazole (DIFLUCAN) 150 MG tablet    Sig: Take 1 tablet (150 mg total) by mouth once for 1 dose. Repeat dose in 48 to 72 hours if yeast infection symptoms persist    Dispense:  2 tablet    Refill:  1  . lidocaine (XYLOCAINE) 5 % ointment    Sig: Apply 1 application topically 4 (four) times daily as needed. Skin pain    Dispense:  35 g    Refill:  1     Follow-up plan: Return if  symptoms worsen or fail to improve.     ############################################ ############################################ ############################################ ############################################    Outpatient Encounter Medications as of 12/25/2017  Medication Sig Note  . cephALEXin (KEFLEX) 250 MG capsule TAKE 3 CAPSULES BY MOUTH TWICE DAILY STARTING  AFTER  SURGERY   . cholecalciferol (VITAMIN D) 1000 units tablet Take 1,000 Units by mouth daily. 11/03/2017: Pt ran out needs to get more  . ferrous sulfate 325 (65 FE) MG EC tablet Take 1 tablet (325 mg total) by mouth daily.   . traMADol (ULTRAM) 50 MG tablet TAKE 1 TABLET BY MOUTH EVERY 4 TO 6 HOURS AS NEEDED FOR PAIN    No facility-administered encounter medications on file as of 12/25/2017.    No Known Allergies    Review of Systems:  Constitutional: No recent illness  Cardiac: No  chest pain  Respiratory:  No  shortness of breath.  Gastrointestinal: No  abdominal pain, no change on bowel habits  Musculoskeletal: No new myalgia/arthralgia  Skin: +Rash  Neurologic: No  weakness, No  Dizziness    Exam:  BP 123/77 (BP Location: Left Arm, Patient Position: Sitting, Cuff Size: Normal)   Pulse 86   Temp 98 F (36.7 C) (Oral)   Wt 150 lb 9.6 oz (68.3 kg)   LMP 11/30/2017 (Approximate)   BMI 26.68 kg/m   Constitutional: VS see above. General Appearance: alert, well-developed, well-nourished, NAD  Eyes: Normal lids and conjunctive,  non-icteric sclera  Ears, Nose, Mouth, Throat: MMM, Normal external inspection ears/nares/mouth/lips/gums.  Neck: No masses, trachea midline.   Respiratory: Normal respiratory effort  Musculoskeletal: Gait normal. Symmetric and independent movement of all extremities  Neurological: Normal balance/coordination. No tremor.  Skin: warm, dry, intact. Vesicular blistering rash on right side of lower back. Consistent with herpes virus, possible  zoster  Psychiatric: Normal judgment/insight. Normal mood and affect. Oriented x3.  GYN: No lesions/ulcers to external genitalia, normal urethra, normal vaginal mucosa, thick whitish discharge, cervix normal without lesions, uterus not enlarged or tender, adnexa no masses and nontender   Visit summary with medication list and pertinent instructions was printed for patient to review, advised to alert [place] if any changes needed. All questions at time of visit were answered - patient instructed to contact office with any additional concerns. ER/RTC precautions were reviewed with the patient and understanding verbalized.   Follow-up plan: Return if symptoms worsen or fail to improve.  Note: Total time spent 25 minutes, greater than 50% of the visit was spent face-to-face counseling and coordinating care for the following: The primary encounter diagnosis was Yeast infection. A diagnosis of Herpes simplex infection of skin was also pertinent to this visit.Marland Kitchen  Please note: voice recognition software was used to produce this document, and typos may escape review. Please contact Dr. Sheppard Coil for any needed clarifications.

## 2017-12-29 ENCOUNTER — Telehealth: Payer: Self-pay

## 2017-12-29 ENCOUNTER — Encounter: Payer: Self-pay | Admitting: Osteopathic Medicine

## 2017-12-30 ENCOUNTER — Other Ambulatory Visit: Payer: Self-pay | Admitting: Osteopathic Medicine

## 2017-12-30 ENCOUNTER — Encounter: Payer: Self-pay | Admitting: Osteopathic Medicine

## 2017-12-30 ENCOUNTER — Ambulatory Visit (INDEPENDENT_AMBULATORY_CARE_PROVIDER_SITE_OTHER): Payer: BLUE CROSS/BLUE SHIELD | Admitting: Osteopathic Medicine

## 2017-12-30 VITALS — BP 124/76 | HR 102 | Temp 98.4°F | Wt 153.3 lb

## 2017-12-30 DIAGNOSIS — B379 Candidiasis, unspecified: Secondary | ICD-10-CM

## 2017-12-30 DIAGNOSIS — B009 Herpesviral infection, unspecified: Secondary | ICD-10-CM

## 2017-12-30 DIAGNOSIS — N898 Other specified noninflammatory disorders of vagina: Secondary | ICD-10-CM | POA: Diagnosis not present

## 2017-12-30 LAB — NO SPECIMEN RECEIVED: A SOURCE 12: 11363

## 2017-12-30 LAB — VIRAL CULTURE VIRC
MICRO NUMBER: 90658323
SPECIMEN QUALITY: ADEQUATE

## 2017-12-30 MED ORDER — FLUCONAZOLE 150 MG PO TABS: 150.0000 mg | ORAL_TABLET | Freq: Once | ORAL | 0 refills | Status: AC

## 2017-12-30 MED ORDER — ACYCLOVIR 5 % EX OINT: 1.0000 "application " | TOPICAL_OINTMENT | CUTANEOUS | 3 refills | Status: DC

## 2017-12-30 NOTE — Telephone Encounter (Signed)
I can send another course of yeast treatment but if it's still bothering her after that she should come see me or we can send to [facility] for a consult. Did she ever fill prescriptions for the other medicines? (valcyclovir antivirals, lidocaine numbing medicine)

## 2017-12-30 NOTE — Telephone Encounter (Signed)
Left VM for Pt to return clinic call regarding recommendations from PCP.

## 2017-12-30 NOTE — Patient Instructions (Signed)
   Will try re-treat for yeast  Urine test for other infection  If discharge/itching persists, will send to OBGYN for second opinion    Try acyclovir ointment for rash on back as needed

## 2017-12-30 NOTE — Progress Notes (Signed)
HPI: Cynthia Weber is a 42 y.o. female who  has a past medical history of Anemia, Fibroid, uterine, Fibroids, and SVD (spontaneous vaginal delivery) (2000).  she presents to [facility] today, 12/30/17,  for chief complaint of:  Persistent vaginal itching  Recently seen and treated for vaginal yeast infection and rash on the lower back presumed HSSV, culture still pending. Rash is looking a good bit better but vaginal Ritching has only improved a little bit. There were no signs of HSV outbreak vaginally. He did try taking the valacyclovir but through up right after taking this, no additional episodes of nausea/vomiting   Past medical history, surgical history, and family history reviewed.  Current medication list and allergy/intolerance information reviewed.   (See remainder of HPI, ROS, Phys Exam below)    ASSESSMENT/PLAN:   Yeast infection  Vaginal discharge - Plan: WET PREP FOR [place], YEAST, CLUE, C. trachomatis/N. gonorrhoeae RNA  Herpes simplex infection of skin   Meds ordered this encounter  Medications  . acyclovir ointment (ZOVIRAX) 5 %    Sig: Apply 1 application topically every 3 (three) hours. For 7 days as needed    Dispense:  30 g    Refill:  3    Patient Instructions   Will try re-treat for yeast  Urine test for other infection  If discharge/itching persists, will send to OBGYN for second opinion    Try acyclovir ointment for rash on back as needed     Follow-up plan: Return if symptoms worsen or fail to improve - call us! Marland Kitchen     ############################################ ############################################ ############################################ ############################################     Review of Systems:  Constitutional: No recent illness  HEENT: No  headache  Cardiac: No  chest pain  Respiratory:  No  shortness of breath  Gastrointestinal: No  abdominal  pain  Musculoskeletal: No new myalgia/arthralgia  Skin: +Rash   Exam:  BP 124/76 (BP Location: Left Arm, Patient Position: Sitting, Cuff Size: Normal)   Pulse (!) 102   Temp 98.4 F (36.9 C) (Oral)   Wt 153 lb 4.8 oz (69.5 kg)   LMP 11/30/2017 (Approximate)   BMI 27.16 kg/m   Constitutional: VS see above. General Appearance: alert, well-developed, well-nourished, NAD  Eyes: Normal lids and conjunctive, non-icteric sclera  Ears, Nose, Mouth, Throat: MMM, Normal external inspection ears/nares/mouth/lips/gums.  Neck: No masses, trachea midline.   Respiratory: Normal respiratory effort. no wheeze, no rhonchi, no rales  Cardiovascular: S1/S2 normal, no murmur, no rub/gallop auscultated. RRR.   Musculoskeletal: Gait normal. Symmetric and independent movement of all extremities  Neurological: Normal balance/coordination. No tremor.  Skin: warm, dry, intact. Ras on back is scabbing over nicely, no erythema or drainage  Psychiatric: Normal judgment/insight. Normal mood and affect. Oriented x3.  GYN: No lesions/ulcers to external genitalia though mucosa at introitus appears probably a bit red, no ulceration, normal urethra, normal vaginal mucosa, thickish white discharge w/o cottage-cheese appearance, no foul odor, cervix normal without lesions, uterus not enlarged or tender, adnexa no masses and nontender   Visit summary with medication list and pertinent instructions was printed for patient to review, advised to alert [place] if any changes needed. All questions at time of visit were answered - patient instructed to contact office with any additional concerns. ER/RTC precautions were reviewed with the patient and understanding verbalized.   Follow-up plan: Return if symptoms worsen or fail to improve - call us! .    Please note: voice recognition software was used  to produce this document, and typos may escape review. Please contact Dr. Sheppard Coil for any needed clarifications.

## 2017-12-31 LAB — C. TRACHOMATIS/N. GONORRHOEAE RNA
C. TRACHOMATIS RNA, TMA: NOT DETECTED
N. gonorrhoeae RNA, TMA: NOT DETECTED

## 2018-01-01 NOTE — Telephone Encounter (Signed)
Pt was seen & re-evaluated on 12/30/17 by PCP.

## 2018-01-15 LAB — WET PREP FOR TRICH, YEAST, CLUE
MICRO NUMBER: 90676304
Specimen Quality: ADEQUATE

## 2018-02-11 IMAGING — CT CT MAXILLOFACIAL W/O CM
3 series · 16 of 47 positions shown, 19 images · non-contrast
Comparison: None.

CLINICAL DATA: Chronic sinus pressure.  Fusion protocol.

EXAM:
CT MAXILLOFACIAL WITHOUT CONTRAST
TECHNIQUE: Multidetector CT imaging of the maxillofacial structures was
performed. Multiplanar CT image reconstructions were also generated.

[Series 3: soft tissue · axial · 0.47mm/px · z∈[+605,+760]mm · 10 of 181 slices shown, 13 images]
[im 13/181  brain]
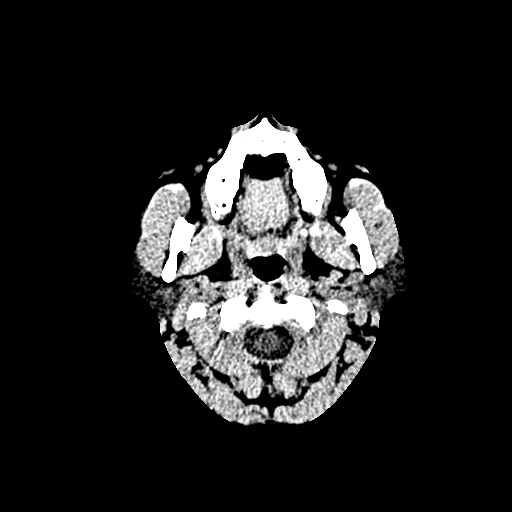
[im 13/181  bone]
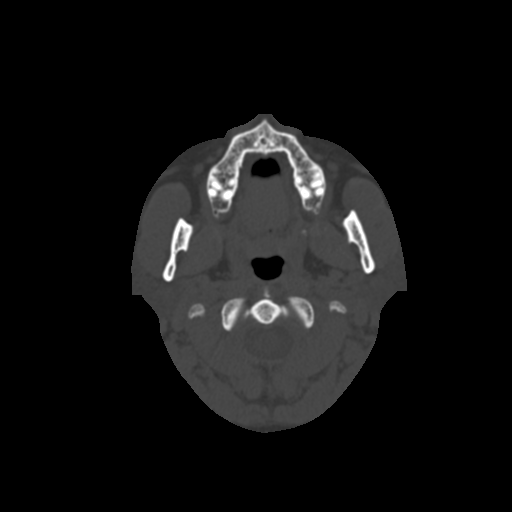
[im 32/181  bone]
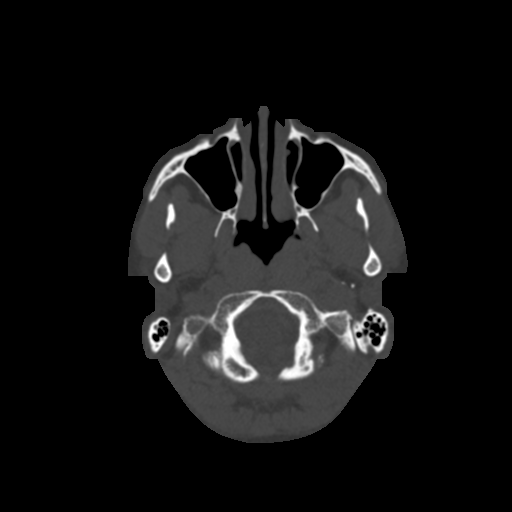
[im 50/181  bone]
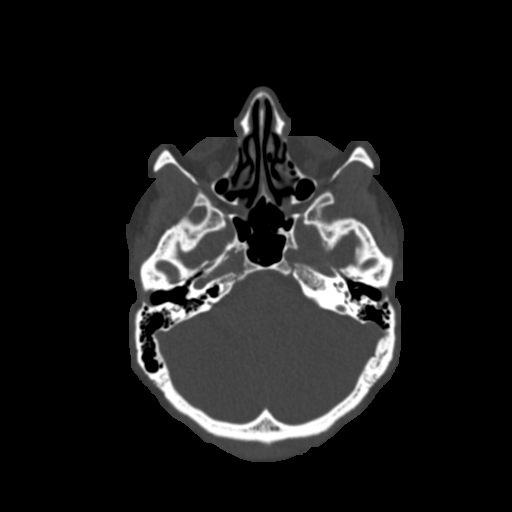
[im 63/181  bone]
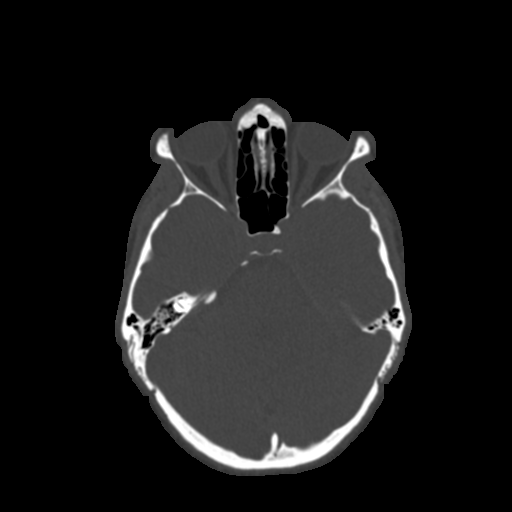
[im 81/181  brain]
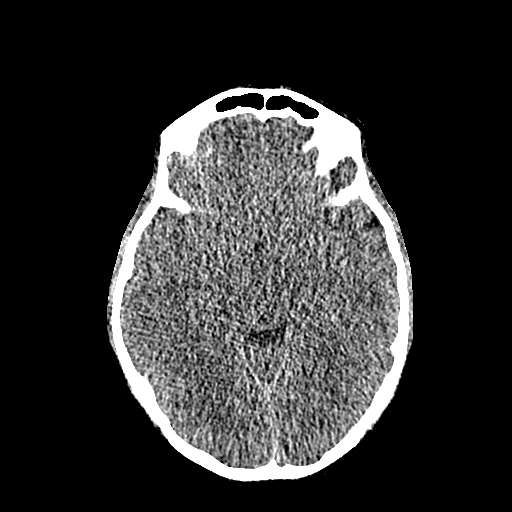
[im 81/181  bone]
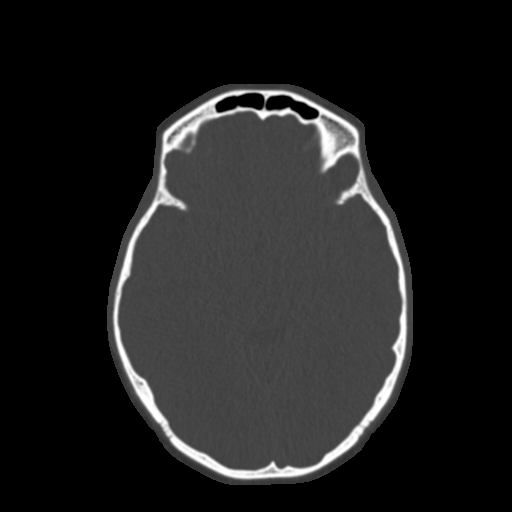
[im 100/181  bone]
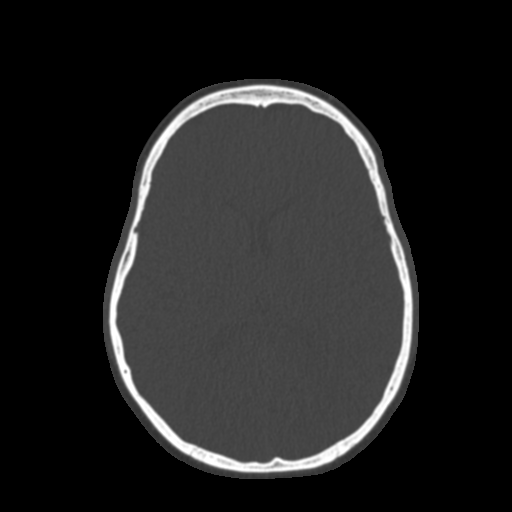
[im 118/181  bone]
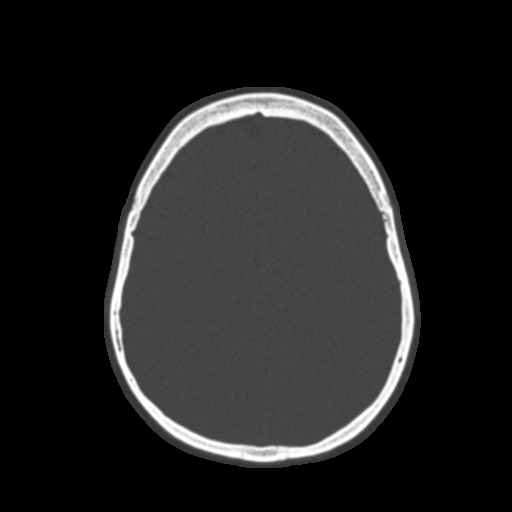
[im 137/181  bone]
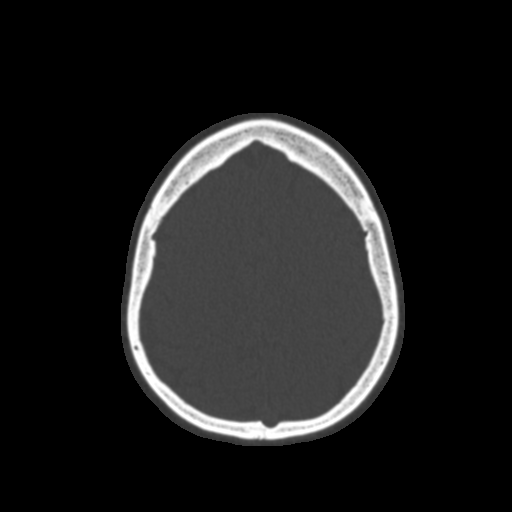
[im 149/181  brain]
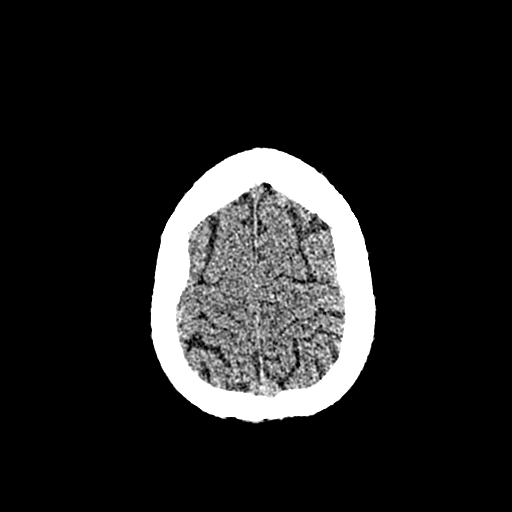
[im 149/181  bone]
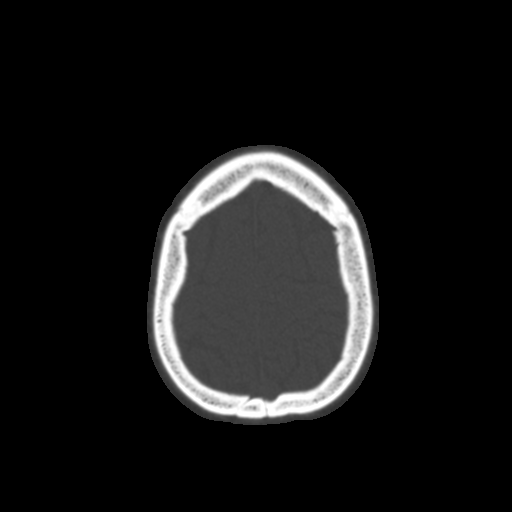
[im 168/181  bone]
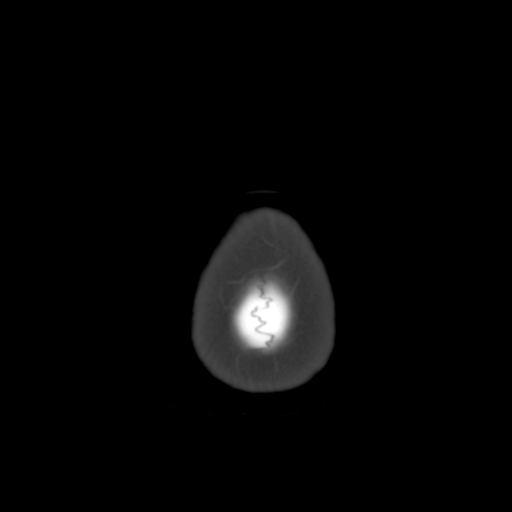

[Series 6: cor soft · coronal · 0.33mm/px · 3 of 105 slices shown]
[im 35/105  bone]
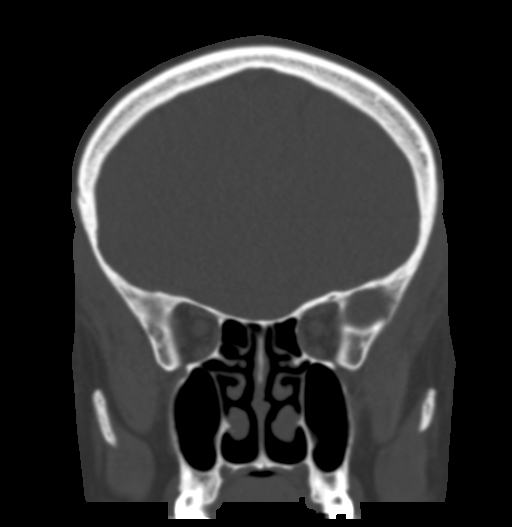
[im 47/105  bone]
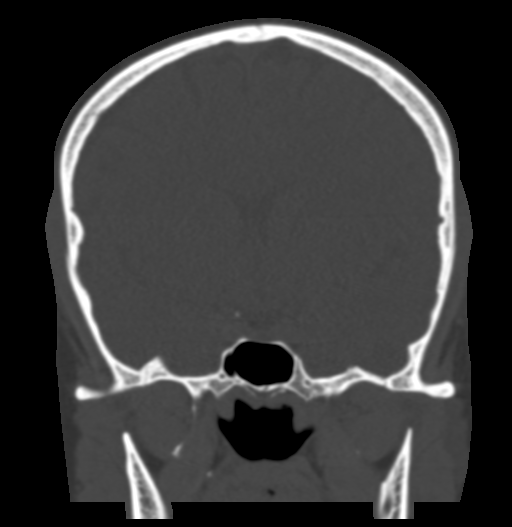
[im 58/105  bone]
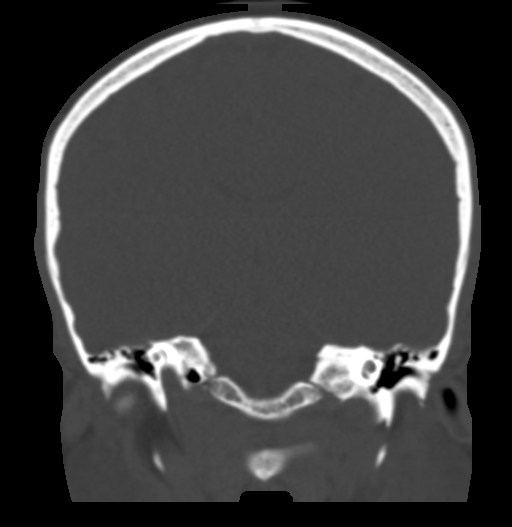

[Series 7: sag soft · sagittal · 0.35mm/px · 3 of 83 slices shown]
[im 28/83  bone]
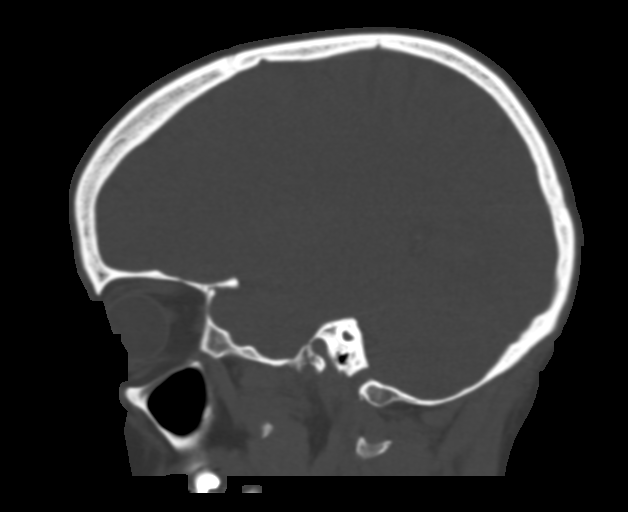
[im 42/83  bone]
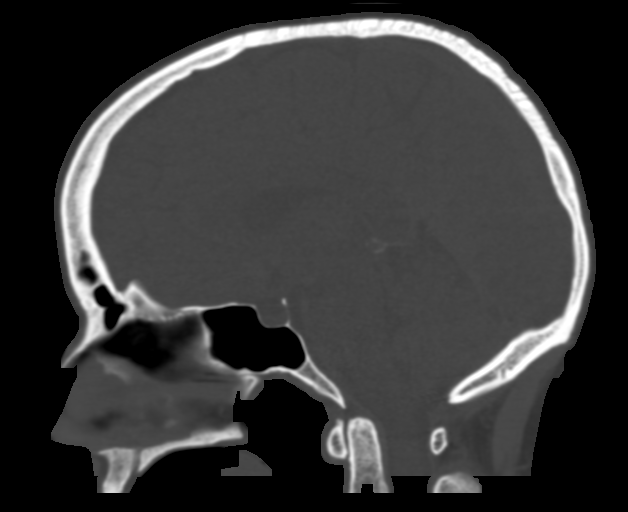
[im 55/83  bone]
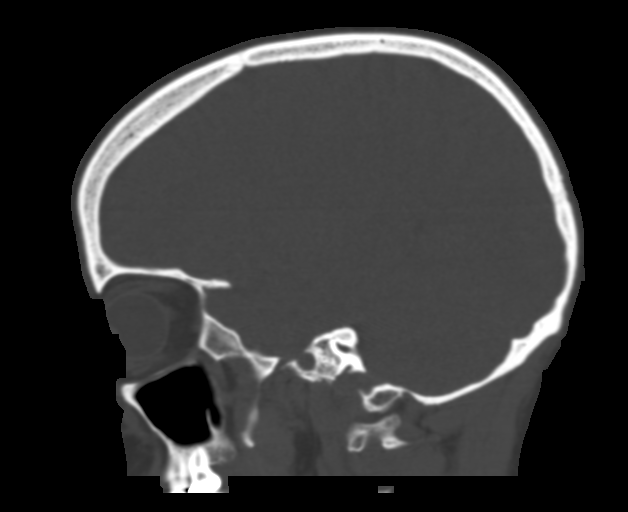

[16 of 47 positions shown; findings below may reference images not displayed]

FINDINGS: Osseous: No fracture or mandibular dislocation. No destructive
process.

Orbits: Negative. No traumatic or inflammatory finding.

Sinuses: Clear.

Soft tissues: Negative.

Limited intracranial: No significant or unexpected finding.
IMPRESSION: Negative exam.

## 2018-03-26 ENCOUNTER — Encounter: Payer: Self-pay | Admitting: Sports Medicine

## 2018-03-26 ENCOUNTER — Ambulatory Visit (INDEPENDENT_AMBULATORY_CARE_PROVIDER_SITE_OTHER): Payer: BLUE CROSS/BLUE SHIELD | Admitting: Sports Medicine

## 2018-03-26 DIAGNOSIS — R200 Anesthesia of skin: Secondary | ICD-10-CM

## 2018-03-26 DIAGNOSIS — M5030 Other cervical disc degeneration, unspecified cervical region: Secondary | ICD-10-CM | POA: Diagnosis not present

## 2018-03-26 DIAGNOSIS — R202 Paresthesia of skin: Secondary | ICD-10-CM

## 2018-03-26 NOTE — Assessment & Plan Note (Addendum)
ymptoms clinically reasonable carpal tunnel syndrome. Nerve conduction study was positive on the right, negative on the left but we do need to understand the limited sensitivity of nerve conduction study and EMG. I am going to treat this as a carpal tunnel syndrome, adding bilateral nighttime splinting, she is desiring to avoid pharmacologic intervention so she will do an anti-inflammatory diet. At the return visit we will proceed with bilateral median nerve hydrodissections if no better.  She also has some paresthesias into the fourth and fifth fingers with abduction, there is likely an element of cubital tunnel syndrome as well but we are going to only treat one nerve at a time for now. I do not think this is thoracic outlet syndrome.  A comprehensive rheumatoid work-up was negative.

## 2018-03-26 NOTE — Progress Notes (Signed)
Subjective:    I'm seeing this patient as a consultation for: Dr. [name]  CC: Bilateral hand numbness and tingling  HPI: This is a pleasant 42 year old female, she does a lot of weightlifting.  For the past several months she has had numbness and tingling in both arms, worse at night with numbness and tingling into all of her fingers, occasional radiation of proximal to the elbow.  In addition when she abduction her arm she gets some tingling going into the fourth and fifth fingers on both side.  She does have minimal pain in her neck and upper shoulders.  No progressive weakness, not dropping things.  She did have a nerve conduction study that showed right sided carpal tunnel syndrome, normal on the left and a cervical spine MRI that showed degenerative disc disease with right-sided C5-C6 foraminal stenosis.  She has never done nighttime splinting.  Has not tried any medication, does desire to proceed with a non-pharmacologic approach.  I reviewed the past medical history, family history, social history, surgical history, and allergies today and no changes were needed.  Please see the problem list section below in epic for further details.  Past Medical History: Past Medical History:  Diagnosis Date  . Anemia   . Fibroid, uterine   . Fibroids   . SVD (spontaneous vaginal delivery) 2000   x 1   Past Surgical History: Past Surgical History:  Procedure Laterality Date  . dental implant     lower back right  . MYOMECTOMY N/A 11/12/2017   Procedure: ABDOMINAL MYOMECTOMY WITH RIGHT TUBAL CYSTECTOMY;  Surgeon: Guss Bunde, MD;  Location: [facility];  Service: Gynecology;  Laterality: N/A;  . PRIMARY CLOSURE N/A 11/12/2017   Procedure: ASSISTING WITH CLOSURE OF ABDOMINAL INCISION AFTER FIBROIDECTOMY;  Surgeon: Wallace Going, DO;  Location: [facility];  Service: Plastics;  Laterality: N/A;  . SINUS SURGERY WITH INSTATRAK  02/27/2017   Dr Benjamine Mola -[facility]  . tummy tuck  2014   [facility]  Ond[facility]oup [facility]  . WISDOM TOOTH EXTRACTION     Social History: Social History   Socioeconomic History  . Marital status: Divorced    Spouse name: Not on file  . Number of children: Not on file  . Years of education: Not on file  . Highest education level: Not on file  Occupational History  . Not on file  Social Needs  . Financial resource strain: Not on file  . Food insecurity:    Worry: Not on file    Inability: Not on file  . Transportation needs:    Medical: Not on file    Non-medical: Not on file  Tobacco Use  . Smoking status: Never Smoker  . Smokeless tobacco: Never Used  Substance and Sexual Activity  . Alcohol use: No    Frequency: Never  . Drug use: No  . Sexual activity: Yes    Birth control/protection: None  Lifestyle  . Physical activity:    Days per week: Not on file    Minutes per session: Not on file  . Stress: Not on file  Relationships  . Social connections:    Talks on phone: Not on file    Gets together: Not on file    Attends religious service: Not on file    Active member of club or organization: Not on file    Attends meetings of clubs or organizations: Not on file    Relationship status: Not on file  Other Topics Concern  .  Not on file  Social History Narrative  . Not on file   Family History: Family History  Problem Relation Age of Onset  . Thyroid cancer Mother   . Heart attack Father    Allergies: Allergies  Allergen Reactions  . Valacyclovir Nausea And Vomiting   Medications: See med rec.  Review of Systems: No headache, visual changes, nausea, vomiting, diarrhea, constipation, dizziness, abdominal pain, skin rash, fevers, chills, night sweats, weight loss, swollen lymph nodes, body aches, joint swelling, muscle aches, chest pain, shortness of breath, mood changes, visual or auditory hallucinations.   Objective:   General: Well Developed, well nourished, and in no acute distress.  Neuro:  Extra-ocular muscles intact, able to  move all 4 extremities, sensation grossly intact.  Deep tendon reflexes tested were normal. Psych: Alert and oriented, mood congruent with affect. ENT:  Ears and nose appear unremarkable.  Hearing grossly normal. Neck: Unremarkable overall appearance, trachea midline.  No visible thyroid enlargement. Eyes: Conjunctivae and lids appear unremarkable.  Pupils equal and round. Skin: Warm and dry, no rashes noted.  Cardiovascular: Pulses palpable, no extremity edema.  Impression and Recommendations:   This case required medical decision making of moderate complexity.  Numbness and tingling in both hands ymptoms clinically reasonable carpal tunnel syndrome. Nerve conduction study was positive on the right, negative on the left but we do need to understand the limited sensitivity of nerve conduction study and EMG. I am going to treat this as a carpal tunnel syndrome, adding bilateral nighttime splinting, she is desiring to avoid pharmacologic intervention so she will do an anti-inflammatory diet. At the return visit we will proceed with bilateral median nerve hydrodissections if no better.  She also has some paresthesias into the fourth and fifth fingers with abduction, there is likely an element of cubital tunnel syndrome as well but we are going to only treat one nerve at a time for now. I do not think this is thoracic outlet syndrome.  A comprehensive rheumatoid work-up was negative.  DDD (degenerative disc disease), cervical Mild cervical DDD, rehab exercises given.  ___________________________________________ Gwen Her. Dianah Field, M.D., ABFM., CAQSM. Primary Care and [place] Instructor of [employer] [facility] [employer]

## 2018-03-26 NOTE — Assessment & Plan Note (Signed)
Mild cervical DDD, rehab exercises given.

## 2018-04-15 ENCOUNTER — Ambulatory Visit: Payer: BLUE CROSS/BLUE SHIELD | Admitting: Obstetrics & Gynecology

## 2018-04-23 ENCOUNTER — Ambulatory Visit: Payer: BLUE CROSS/BLUE SHIELD | Admitting: Sports Medicine

## 2018-04-26 ENCOUNTER — Encounter: Payer: Self-pay | Admitting: Obstetrics & Gynecology

## 2018-04-26 ENCOUNTER — Ambulatory Visit (INDEPENDENT_AMBULATORY_CARE_PROVIDER_SITE_OTHER): Payer: BLUE CROSS/BLUE SHIELD | Admitting: Obstetrics & Gynecology

## 2018-04-26 VITALS — BP 125/77 | HR 80 | Ht 64.0 in | Wt 153.0 lb

## 2018-04-26 DIAGNOSIS — Z1329 Encounter for screening for other suspected endocrine disorder: Secondary | ICD-10-CM

## 2018-04-26 DIAGNOSIS — Z01419 Encounter for gynecological examination (general) (routine) without abnormal findings: Secondary | ICD-10-CM

## 2018-04-26 DIAGNOSIS — Z13220 Encounter for screening for lipoid disorders: Secondary | ICD-10-CM

## 2018-04-26 DIAGNOSIS — Z1321 Encounter for screening for nutritional disorder: Secondary | ICD-10-CM | POA: Diagnosis not present

## 2018-04-26 NOTE — Progress Notes (Signed)
Last pap- 04/07/17- negative

## 2018-04-27 NOTE — Progress Notes (Signed)
Subjective:     Cynthia Weber is a 42 y.o. female here for a routine exam.  Current complaints: none.  Menses is manageable.  Still considering pregnancy 1 year out from surgery.  Each month several days before her period, she feels like she is getting a yeast infection.  Pt has had a wet prep but not Aptima.  Symptoms go away on their on, but it is uncomfortable.    Gynecologic History Patient's last menstrual period was 03/26/2018. Contraception: condoms Last Pap: 2018. Results were: normal Last mammogram: N/A.  Pt wants to think about when to start testing.  She was given the multiple options and knows she is low risk based of family history.  Obstetric History OB History  Gravida Para Term Preterm AB Living  1 1 1     1   SAB TAB Ectopic Multiple Live Births          1    # Outcome Date GA Lbr Len/2nd Weight Sex Delivery Anes PTL Lv  1 Term     F Vag-Spont EPI  LIV     The following portions of the patient's history were reviewed and updated as appropriate: allergies, current medications, past family history, past medical history, past social history, past surgical history and problem list.  Review of Systems Pertinent items noted in HPI and remainder of comprehensive ROS otherwise negative.    Objective:      Vitals:   04/26/18 1342  BP: 125/77  Pulse: 80  Weight: 153 lb (69.4 kg)  Height: 5\' 4"  (1.626 m)   Vitals:  WNL General appearance: alert, cooperative and no distress  HEENT: Normocephalic, without obvious abnormality, atraumatic Eyes: negative Throat: lips, mucosa, and tongue normal; teeth and gums normal  Respiratory: Clear to auscultation bilaterally  CV: Regular rate and rhythm  Breasts:  Normal appearance, no masses or tenderness, no nipple retraction or dimpling  GI: Soft, non-tender; bowel sounds normal; no masses,  no organomegaly  GU: External Genitalia:  Tanner V, no lesion Urethra:  No prolapse   Vagina: Pink, normal rugae, no blood or  discharge  Cervix: No CMT, no lesion  Uterus:  Normal size and contour, non tender  Adnexa: Normal, no masses, non tender  Musculoskeletal: No edema, redness or tenderness in the calves or thighs  Skin: No lesions or rash  Lymphatic: Axillary adenopathy: none     Psychiatric: Normal mood and behavior        Assessment:    Healthy female exam.    Plan:    1.  Pap due in 2021 earliest 2.  Aptima self swab given.  Pt can complete herself and bring in or have RN complete when she is feeling symptoms.  Aptima has greater sensitivity than wet prep. 3.  Pt will call if she wold like mammogram before next yearly visit.

## 2018-06-09 ENCOUNTER — Ambulatory Visit (INDEPENDENT_AMBULATORY_CARE_PROVIDER_SITE_OTHER): Payer: BLUE CROSS/BLUE SHIELD | Admitting: Osteopathic Medicine

## 2018-06-09 ENCOUNTER — Telehealth: Payer: Self-pay

## 2018-06-09 ENCOUNTER — Ambulatory Visit (INDEPENDENT_AMBULATORY_CARE_PROVIDER_SITE_OTHER): Payer: BLUE CROSS/BLUE SHIELD

## 2018-06-09 ENCOUNTER — Encounter: Payer: Self-pay | Admitting: Osteopathic Medicine

## 2018-06-09 VITALS — BP 120/75 | HR 101 | Temp 97.8°F | Wt 151.2 lb

## 2018-06-09 DIAGNOSIS — R05 Cough: Secondary | ICD-10-CM

## 2018-06-09 DIAGNOSIS — J019 Acute sinusitis, unspecified: Secondary | ICD-10-CM | POA: Diagnosis not present

## 2018-06-09 DIAGNOSIS — R059 Cough, unspecified: Secondary | ICD-10-CM

## 2018-06-09 DIAGNOSIS — J40 Bronchitis, not specified as acute or chronic: Secondary | ICD-10-CM | POA: Diagnosis not present

## 2018-06-09 MED ORDER — AZITHROMYCIN 250 MG PO TABS: ORAL_TABLET | ORAL | 0 refills | Status: DC

## 2018-06-09 MED ORDER — HYDROCOD POLST-CPM POLST ER 10-8 MG/5ML PO SUER: 5.0000 mL | Freq: Two times a day (BID) | ORAL | 0 refills | Status: DC | PRN

## 2018-06-09 MED ORDER — IPRATROPIUM BROMIDE 0.06 % NA SOLN: 2.0000 | Freq: Four times a day (QID) | NASAL | 1 refills | Status: DC

## 2018-06-09 MED ORDER — GUAIFENESIN-CODEINE 100-10 MG/5ML PO SYRP: 5.0000 mL | ORAL_SOLUTION | Freq: Four times a day (QID) | ORAL | 0 refills | Status: DC | PRN

## 2018-06-09 MED ORDER — PREDNISONE 20 MG PO TABS: 20.0000 mg | ORAL_TABLET | Freq: Two times a day (BID) | ORAL | 0 refills | Status: DC

## 2018-06-09 NOTE — Patient Instructions (Signed)
Plan: See below for OTC treatments Prescriptions sent:   Meds ordered this encounter  Medications  . guaiFENesin-codeine (ROBITUSSIN AC) 100-10 MG/5ML syrup    Sig: Take 5-10 mLs by mouth 4 (four) times daily as needed for cough.    Dispense:  180 mL    Refill:  0  . ipratropium (ATROVENT) 0.06 % nasal spray    Sig: Place 2 sprays into both nostrils 4 (four) times daily for nasal congestion    Dispense:  15 mL    Refill:  1  . predniSONE (DELTASONE) 20 MG tablet for cough/inflammation     Sig: Take 1 tablet (20 mg total) by mouth 2 (two) times daily with a meal     Dispense:  10 tablet    Refill:  0  . azithromycin (ZITHROMAX) 250 MG tablet - antibiotic, likely sinus/bronchitis    Sig: 2 tabs po on Day 1, then 1 tab daily Days 2 - 5    Dispense:  6 tablet    Refill:  0      Over-the-Counter Medications & Home Remedies  for Upper Respiratory Illness  Aches/Pains, Fever, Headache Acetaminophen (Tylenol) 500 mg tablets - take max 2 tablets (1000 mg) every 6 hours (4 times per day)  Ibuprofen (Motrin) 200 mg tablets - take max 4 tablets (800 mg) every 6 hours  Sinus Congestion Prescription Atrovent as directed Nasal Saline if desired Oxymetolazone (Afrin, others) sparing use due to rebound congestion, NEVER use in kids Phenylephrine (Sudafed) 10 mg tablets every 4 hours (or the 12-hour formulation) Diphenhydramine (Benadryl) 25 mg tablets - take max 2 tablets every 4 hours  Cough & Sore Throat Dextromethorphan (Robitussin, others) - cough suppressant Guaifenesin (Robitussin, Mucinex, others) - expectorant (helps cough up mucus) (Dextromethorphan and Guaifenesin also come in a combination tablet) Lozenges w/ Benzocaine + Menthol (Cepacol) Honey - as much as you want! Teas which "coat the throat" - look for ingredients Elm Bark, Licorice Root, Marshmallow Root  Other Zinc Lozenges within 24 hours of symptoms onset - mixed evidence this shortens the duration of the common  cold Don't waste your money on Vitamin C or Echinacea

## 2018-06-09 NOTE — Progress Notes (Signed)
HPI: Cynthia Weber is a 42 y.o. female who  has a past medical history of Anemia and SVD (spontaneous vaginal delivery) (2000).  she presents to [facility] today, 06/09/18,  for chief complaint of: Sick - respiratory   3 weeks, cough/wheeze, SOB, occasionally productive cough. Assoc w/ sinus pressure and yellow mucus. Feels like congestion is in the chest but also in the sinuses.      Past medical, surgical, social and family history reviewed:  Patient Active Problem List   Diagnosis Date Noted  . DDD (degenerative disc disease), cervical 03/26/2018  . S/P myomectomy 11/12/2017  . Fibroid 07/27/2017  . Low ferritin 06/02/2017  . Vitamin D deficiency 06/02/2017  . Metatarsalgia 05/26/2017  . Numbness and tingling in both hands 05/11/2017  . Menorrhagia with regular cycle 04/07/2017  . Family history of MI (myocardial infarction) 04/07/2017  . Family history of thyroid disease 04/07/2017  . HSV-2 seropositive 04/07/2017    Past Surgical History:  Procedure Laterality Date  . dental implant     lower back right  . MYOMECTOMY N/A 11/12/2017   Procedure: ABDOMINAL MYOMECTOMY WITH RIGHT TUBAL CYSTECTOMY;  Surgeon: Guss Bunde, MD;  Location: [facility];  Service: Gynecology;  Laterality: N/A;  . PRIMARY CLOSURE N/A 11/12/2017   Procedure: ASSISTING WITH CLOSURE OF ABDOMINAL INCISION AFTER FIBROIDECTOMY;  Surgeon: Wallace Going, DO;  Location: [facility];  Service: Plastics;  Laterality: N/A;  . SINUS SURGERY WITH INSTATRAK  02/27/2017   Dr Benjamine Mola -[facility]  . tummy tuck  2014   Dr. Crickett Coup at [facility]  . WISDOM TOOTH EXTRACTION      Social History   Tobacco Use  . Smoking status: Never Smoker  . Smokeless tobacco: Never Used  Substance Use Topics  . Alcohol use: No    Frequency: Never    Family History  Problem Relation Age of Onset  . Thyroid cancer Mother   . Heart attack Father      Current medication list and  allergy/intolerance information reviewed:    Current Outpatient Medications  Medication Sig Dispense Refill  . acyclovir ointment (ZOVIRAX) 5 % Apply 1 application topically every 3 (three) hours. For 7 days as needed (Patient not taking: Reported on 04/26/2018) 30 g 3  . cholecalciferol (VITAMIN D) 1000 units tablet Take 1,000 Units by mouth daily.    . ferrous sulfate 325 (65 FE) MG EC tablet Take 1 tablet (325 mg total) by mouth daily. (Patient not taking: Reported on 04/26/2018) 90 tablet 3  . lidocaine (XYLOCAINE) 5 % ointment Apply 1 application topically 4 (four) times daily as needed. Skin pain (Patient not taking: Reported on 04/26/2018) 35 g 1  . traMADol (ULTRAM) 50 MG tablet TAKE 1 TABLET BY MOUTH EVERY 4 TO 6 HOURS AS NEEDED FOR PAIN  0   No current facility-administered medications for this visit.     Allergies  Allergen Reactions  . Valacyclovir Nausea And Vomiting      Review of Systems:  Constitutional:  No  fever, no chills, +recent illness, No unintentional weight changes. +significant fatigue.   HEENT: No  headache, no vision change, no hearing change, No sore throat, +sinus pressure  Cardiac: No  chest pain, No  pressure, No palpitations  Respiratory:  No  shortness of breath. +Cough  Gastrointestinal: No  abdominal pain, No  nausea, No  vomiting,  No  blood in stool, No  diarrhea  Musculoskeletal: No new myalgia/arthralgia  Skin: No  Rash  Neurologic: No  weakness, No  dizziness  Exam:  BP 120/75 (BP Location: Left Arm, Patient Position: Sitting, Cuff Size: Normal)   Pulse (!) 101   Temp 97.8 F (36.6 C) (Oral)   Wt 151 lb 3.2 oz (68.6 kg)   SpO2 98%   BMI 25.95 kg/m   Constitutional: VS see above. General Appearance: alert, well-developed, well-nourished, NAD  Eyes: Normal lids and conjunctive, non-icteric sclera  Ears, Nose, Mouth, Throat: MMM, Normal external inspection ears/nares/mouth/lips/gums. TM normal bilaterally. Pharynx/tonsils  +erythema, no exudate. Nasal mucosa normal.   Neck: No masses, trachea midline. No tenderness/mass appreciated. No lymphadenopathy  Respiratory: Normal respiratory effort. no wheeze, no rhonchi, no rales  Cardiovascular: S1/S2 normal, no murmur, no rub/gallop auscultated. RRR. No lower extremity edema.   Gastrointestinal: Nontender, no masses. Bowel sounds normal.  Musculoskeletal: Gait normal.   Neurological: Normal balance/coordination. No tremor.   Skin: warm, dry, intact.   Psychiatric: Normal judgment/insight. Normal mood and affect.    Dg Chest 2 View  Result Date: 06/09/2018 CLINICAL DATA:  Persistent cough for several weeks EXAM: CHEST - 2 VIEW COMPARISON:  None. FINDINGS: The heart size and mediastinal contours are within normal limits. Both lungs are clear. The visualized skeletal structures are unremarkable. IMPRESSION: No active cardiopulmonary disease. Electronically Signed   By: Inez Catalina M.D.   On: 06/09/2018 09:10        ASSESSMENT/PLAN: The primary encounter diagnosis was Cough. Diagnoses of Bronchitis and Acute non-recurrent sinusitis, unspecified location were also pertinent to this visit.  Orders Placed This Encounter  Procedures  . DG Chest 2 View      Patient Instructions   Plan: See below for OTC treatments Prescriptions sent:   Meds ordered this encounter  Medications  . guaiFENesin-codeine (ROBITUSSIN AC) 100-10 MG/5ML syrup    Sig: Take 5-10 mLs by mouth 4 (four) times daily as needed for cough.    Dispense:  180 mL    Refill:  0  . ipratropium (ATROVENT) 0.06 % nasal spray    Sig: Place 2 sprays into both nostrils 4 (four) times daily for nasal congestion    Dispense:  15 mL    Refill:  1  . predniSONE (DELTASONE) 20 MG tablet for cough/inflammation     Sig: Take 1 tablet (20 mg total) by mouth 2 (two) times daily with a meal     Dispense:  10 tablet    Refill:  0  . azithromycin (ZITHROMAX) 250 MG tablet - antibiotic, likely  sinus/bronchitis    Sig: 2 tabs po on Day 1, then 1 tab daily Days 2 - 5    Dispense:  6 tablet    Refill:  0      Over-the-Counter Medications & Home Remedies  for Upper Respiratory Illness  Aches/Pains, Fever, Headache Acetaminophen (Tylenol) 500 mg tablets - take max 2 tablets (1000 mg) every 6 hours (4 times per day)  Ibuprofen (Motrin) 200 mg tablets - take max 4 tablets (800 mg) every 6 hours  Sinus Congestion Prescription Atrovent as directed Nasal Saline if desired Oxymetolazone (Afrin, others) sparing use due to rebound congestion, NEVER use in kids Phenylephrine (Sudafed) 10 mg tablets every 4 hours (or the 12-hour formulation) Diphenhydramine (Benadryl) 25 mg tablets - take max 2 tablets every 4 hours  Cough & Sore Throat Dextromethorphan (Robitussin, others) - cough suppressant Guaifenesin (Robitussin, Mucinex, others) - expectorant (helps cough up mucus) (Dextromethorphan and Guaifenesin also come in a combination tablet) Lozenges w/ Benzocaine +  Menthol (Cepacol) Honey - as much as you want! Teas which "coat the throat" - look for ingredients Elm Bark, Licorice Root, Marshmallow Root  Other Zinc Lozenges within 24 hours of symptoms onset - mixed evidence this shortens the duration of the common cold Don't waste your money on Vitamin C or Echinacea         Visit summary with medication list and pertinent instructions was printed for patient to review. All questions at time of visit were answered - patient instructed to contact office with any additional concerns. ER/RTC precautions were reviewed with the patient.   Follow-up plan: Return if symptoms worsen or fail to improve.    Please note: voice recognition software was used to produce this document, and typos may escape review. Please contact Dr. Sheppard Coil for any needed clarifications.

## 2018-06-09 NOTE — Telephone Encounter (Signed)
Delta's insurance will not pay for the cough syrup and request a different medication. Her insurance will cover Hydronet or tussinex.

## 2018-06-09 NOTE — Telephone Encounter (Signed)
Patient advised.

## 2018-06-09 NOTE — Telephone Encounter (Signed)
Alternative sent

## 2018-06-29 DIAGNOSIS — J3489 Other specified disorders of nose and nasal sinuses: Secondary | ICD-10-CM | POA: Insufficient documentation

## 2018-07-01 ENCOUNTER — Ambulatory Visit (INDEPENDENT_AMBULATORY_CARE_PROVIDER_SITE_OTHER): Payer: BLUE CROSS/BLUE SHIELD | Admitting: Obstetrics & Gynecology

## 2018-07-01 ENCOUNTER — Other Ambulatory Visit: Payer: Self-pay | Admitting: Osteopathic Medicine

## 2018-07-01 ENCOUNTER — Encounter: Payer: Self-pay | Admitting: Obstetrics & Gynecology

## 2018-07-01 VITALS — BP 130/76 | HR 86 | Resp 16 | Ht 64.0 in | Wt 150.0 lb

## 2018-07-01 DIAGNOSIS — B009 Herpesviral infection, unspecified: Secondary | ICD-10-CM

## 2018-07-01 NOTE — Progress Notes (Signed)
   Subjective:    Patient ID: Cynthia Weber, female    DOB: February 12, 1976, 42 y.o.   MRN: [id number]  HPI 42 yo lady here with a flare of her HSV on her right buttock. This started years ago and has not been a problem until recently. She was given valtrex by Dr. Sheppard Coil. She reports that she had vomiting after taking that so she then switched to cream acyclovir. She just wanted to make sure that she could not spread this to her daughter through contact of the toilet seat. She also wanted to know if I had any other treatment options.  Review of Systems     Objective:   Physical Exam Breathing, conversing, and ambulating normally Well nourished, well hydrated White female, no apparent distress Several HSV lesions in various stages of healing     Assessment & Plan:  HSV- I offered that maybe she could try oral valtrex again, taking half of the pill I reassured her about how HSV is spread but suggested that she wipe off the toilet seat just to make herself feel better. Come back for annual/prn sooner

## 2018-07-02 NOTE — Telephone Encounter (Signed)
Requested medication (s) are due for refill today: Yes  Requested medication (s) are on the active medication list: Yes  Last refill:  12/30/17  Future visit scheduled: No  Notes to clinic:  See request    Requested Prescriptions  Pending Prescriptions Disp Refills   acyclovir ointment (ZOVIRAX) 5 % [Pharmacy Med Name: ACYCLOVIR 5% OIN]  3    Sig:  APPLY TOPICALLY EVERY 3 HOURS AS NEEDED FOR 7 DAYS     Antimicrobials:  Antiviral Agents - Anti-Herpetic Passed - 07/01/2018 10:49 PM      Passed - Valid encounter within last 12 months    Recent Outpatient Visits          3 weeks ago Cough   [place], Lanelle Bal, DO   3 months ago Numbness and tingling in both hands   [place], Gwen Her, MD   6 months ago Yeast infection   [facility], Lanelle Bal, DO   6 months ago Yeast infection   [facility], Lanelle Bal, DO   1 year ago Paresthesia of upper extremity   [facility], Rebekah [place], MD

## 2018-07-02 NOTE — Telephone Encounter (Signed)
Patient seen by Dr Hulan Fray yesterday for HSV.

## 2018-09-03 ENCOUNTER — Telehealth: Payer: Self-pay

## 2018-09-03 MED ORDER — OSELTAMIVIR PHOSPHATE 75 MG PO CAPS: 75.0000 mg | ORAL_CAPSULE | Freq: Every day | ORAL | 0 refills | Status: DC

## 2018-09-03 NOTE — Telephone Encounter (Signed)
Cynthia Weber called and states her live in boyfriend was diagnosed with the Flu. She reports no symptoms. She would like Tamiflu. Please advise.   CMP     Component Value Date/Time   NA 139 04/07/2017 0815   K 4.3 04/07/2017 0815   CL 106 04/07/2017 0815   CO2 24 04/07/2017 0815   GLUCOSE 97 07/24/2017 0824   BUN 10 04/07/2017 0815   CREATININE 0.82 04/07/2017 0815   CALCIUM 9.7 04/07/2017 0815   PROT 7.0 04/07/2017 0815   AST 24 04/07/2017 0815   ALT 28 04/07/2017 0815   BILITOT 0.6 04/07/2017 0815   GFRNONAA 90 04/07/2017 0815   GFRAA 104 04/07/2017 0815

## 2018-09-03 NOTE — Telephone Encounter (Signed)
Ok to send

## 2018-09-04 ENCOUNTER — Encounter: Payer: Self-pay | Admitting: Emergency Medicine

## 2018-09-04 ENCOUNTER — Emergency Department (INDEPENDENT_AMBULATORY_CARE_PROVIDER_SITE_OTHER)
Admission: EM | Admit: 2018-09-04 | Discharge: 2018-09-04 | Disposition: A | Discharge status: Home / Self Care | Payer: BLUE CROSS/BLUE SHIELD | Source: Home / Self Care

## 2018-09-04 DIAGNOSIS — Z20828 Contact with and (suspected) exposure to other viral communicable diseases: Secondary | ICD-10-CM

## 2018-09-04 DIAGNOSIS — R509 Fever, unspecified: Secondary | ICD-10-CM | POA: Diagnosis not present

## 2018-09-04 DIAGNOSIS — R6889 Other general symptoms and signs: Secondary | ICD-10-CM

## 2018-09-04 LAB — POCT INFLUENZA A/B
Influenza A, POC: NEGATIVE
Influenza B, POC: NEGATIVE

## 2018-09-04 MED ORDER — OSELTAMIVIR PHOSPHATE 75 MG PO CAPS: 75.0000 mg | ORAL_CAPSULE | Freq: Two times a day (BID) | ORAL | 0 refills | Status: DC

## 2018-09-04 NOTE — Discharge Instructions (Signed)
  You may take 500mg acetaminophen every 4-6 hours or in combination with ibuprofen 400-600mg every 6-8 hours as needed for pain, inflammation, and fever.  Be sure to well hydrated with clear liquids and get at least 8 hours of sleep at night, preferably more while sick.   Please follow up with family medicine in 1 week if needed.   

## 2018-09-04 NOTE — ED Triage Notes (Signed)
Patient has a fever and cough, exposed to flu A, requesting flu test.

## 2018-09-04 NOTE — ED Provider Notes (Signed)
Cynthia Weber CARE    CSN: [id number] Arrival date & time: 09/04/18  1452     History   Chief Complaint Chief Complaint  Patient presents with  . Fever    HPI Cynthia Weber is a 43 y.o. female.   HPI Cynthia Weber is a 43 y.o. female presenting to UC with c/o sudden onset fever, cough, mild congestion and mild body aches since last night. Her boyfriend was dx with the flu earlier yesterday. She was prescribed a preventative dose of Tamiflu but had not started it yet because she takes supplements and was concerned about unknown interactions. She is requsting to be tested for the flu, she does not want to start tamiflu if she does not need to. She feels "okay" at this time.  She has not taken any medication today for her symptoms. She did not receive the flu vaccine.  Pt also concerned about her 72yo mother and 54yo daughter getting the flu as they were not vaccinated but are currently not having symptoms.   Past Medical History:  Diagnosis Date  . Anemia   . SVD (spontaneous vaginal delivery) 2000   x 1    Patient Active Problem List   Diagnosis Date Noted  . DDD (degenerative disc disease), cervical 03/26/2018  . S/P myomectomy 11/12/2017  . Fibroid 07/27/2017  . Low ferritin 06/02/2017  . Vitamin D deficiency 06/02/2017  . Metatarsalgia 05/26/2017  . Numbness and tingling in both hands 05/11/2017  . Menorrhagia with regular cycle 04/07/2017  . Family history of MI (myocardial infarction) 04/07/2017  . Family history of thyroid disease 04/07/2017  . HSV-2 seropositive 04/07/2017    Past Surgical History:  Procedure Laterality Date  . dental implant     lower back right  . MYOMECTOMY N/A 11/12/2017   Procedure: ABDOMINAL MYOMECTOMY WITH RIGHT TUBAL CYSTECTOMY;  Surgeon: Guss Bunde, MD;  Location: [facility];  Service: Gynecology;  Laterality: N/A;  . PRIMARY CLOSURE N/A 11/12/2017   Procedure: ASSISTING WITH CLOSURE OF ABDOMINAL INCISION AFTER  FIBROIDECTOMY;  Surgeon: Wallace Going, DO;  Location: [facility];  Service: Plastics;  Laterality: N/A;  . SINUS SURGERY WITH INSTATRAK  02/27/2017   Dr Benjamine Mola -[facility]  . tummy tuck  2014   Dr. Christien Coup at [facility]  . WISDOM TOOTH EXTRACTION      OB History    Gravida  1   Para  1   Term  1   Preterm      AB      Living  1     SAB      TAB      Ectopic      Multiple      Live Births  1            Home Medications    Prior to Admission medications   Medication Sig Start Date End Date Taking? Authorizing Provider  cholecalciferol (VITAMIN D) 1000 units tablet Take 1,000 Units by mouth daily.    [provider]  ferrous sulfate 325 (65 FE) MG EC tablet Take 1 tablet (325 mg total) by mouth daily. 11/14/17   Truett Mainland, DO  oseltamivir (TAMIFLU) 75 MG capsule Take 1 capsule (75 mg total) by mouth every 12 (twelve) hours. 09/04/18   Noe Gens, PA-C    Family History Family History  Problem Relation Age of Onset  . Thyroid cancer Mother   . Heart attack Father     Social History  Social History   Tobacco Use  . Smoking status: Never Smoker  . Smokeless tobacco: Never Used  Substance Use Topics  . Alcohol use: No    Frequency: Never  . Drug use: No     Allergies   Valacyclovir   Review of Systems Review of Systems  Constitutional: Positive for chills, fatigue and fever.  HENT: Positive for congestion. Negative for ear pain, sore throat, trouble swallowing and voice change.   Respiratory: Positive for cough. Negative for shortness of breath.   Cardiovascular: Negative for chest pain and palpitations.  Gastrointestinal: Negative for abdominal pain, diarrhea, nausea and vomiting.  Musculoskeletal: Positive for arthralgias, back pain and myalgias.  Skin: Negative for rash.  Neurological: Positive for headaches. Negative for dizziness and light-headedness.     Physical Exam Triage Vital Signs ED Triage Vitals  Enc Vitals Group      BP 09/04/18 1512 (!) 144/87     Pulse Rate 09/04/18 1512 (!) 130     Resp --      Temp 09/04/18 1512 (!) 100.8 F (38.2 C)     Temp Source 09/04/18 1512 Oral     SpO2 09/04/18 1512 98 %     Weight 09/04/18 1513 150 lb 12 oz (68.4 kg)     Height 09/04/18 1513 5\' 3"  (1.6 m)     Head Circumference --      Peak Flow --      Pain Score 09/04/18 1513 0     Pain Loc --      Pain Edu? --      Excl. in [place]? --    No data found.  Updated Vital Signs BP (!) 144/87 (BP Location: Right Arm)   Pulse (!) 130   Temp (!) 100.8 F (38.2 C) (Oral)   Ht 5\' 3"  (1.6 m)   Wt 150 lb 12 oz (68.4 kg)   LMP 08/26/2018   SpO2 98%   BMI 26.70 kg/m   Visual Acuity Right Eye Distance:   Left Eye Distance:   Bilateral Distance:    Right Eye Near:   Left Eye Near:    Bilateral Near:     Physical Exam Vitals signs and nursing note reviewed.  Constitutional:      Appearance: Normal appearance. She is well-developed.  HENT:     Head: Normocephalic and atraumatic.     Right Ear: Tympanic membrane normal.     Left Ear: Tympanic membrane normal.     Nose: Nose normal.     Right Sinus: No maxillary sinus tenderness or frontal sinus tenderness.     Left Sinus: No maxillary sinus tenderness or frontal sinus tenderness.     Mouth/Throat:     Lips: Pink.     Mouth: Mucous membranes are moist.     Pharynx: Oropharynx is clear. Uvula midline.  Neck:     Musculoskeletal: Normal range of motion.  Cardiovascular:     Rate and Rhythm: Regular rhythm. Tachycardia present.  Pulmonary:     Effort: Pulmonary effort is normal. No respiratory distress.     Breath sounds: Normal breath sounds. No stridor. No wheezing or rhonchi.  Musculoskeletal: Normal range of motion.  Skin:    General: Skin is warm and dry.     Findings: No rash.  Neurological:     Mental Status: She is alert and oriented to person, place, and time.  Psychiatric:        Behavior: Behavior normal.      UC  Treatments / Results   Labs (all labs ordered are listed, but only abnormal results are displayed) Labs Reviewed  POCT INFLUENZA A/B    EKG None  Radiology No results found.  Procedures Procedures (including critical care time)  Medications Ordered in UC Medications - No data to display  Initial Impression / Assessment and Plan / UC Course  I have reviewed the triage vital signs and the nursing notes.  Pertinent labs & imaging results that were available during my care of the patient were reviewed by me and considered in my medical decision making (see chart for details).     Rapid flu: NEGATIVE Discussed possibility of false negative, pt likely still has the flu given exposure and sudden onset of symptoms. Pt is still in recommended 48 hour window for Tamiflu Home care info provided  Final Clinical Impressions(s) / UC Diagnoses   Final diagnoses:  Fever, unspecified  Flu-like symptoms  Exposure to the flu     Discharge Instructions      You may take 500mg  acetaminophen every 4-6 hours or in combination with ibuprofen 400-600mg  every 6-8 hours as needed for pain, inflammation, and fever.  Be sure to well hydrated with clear liquids and get at least 8 hours of sleep at night, preferably more while sick.   Please follow up with family medicine in 1 week if needed.     ED Prescriptions    Medication Sig Dispense Auth. Provider   oseltamivir (TAMIFLU) 75 MG capsule Take 1 capsule (75 mg total) by mouth every 12 (twelve) hours. 10 capsule Noe Gens, PA-C     Controlled Substance Prescriptions [place] Controlled Substance Registry consulted? Not Applicable   Tyrell Antonio 09/04/18 [id number]

## 2018-09-06 NOTE — Telephone Encounter (Signed)
Patient went to urgent care

## 2018-09-13 ENCOUNTER — Encounter: Payer: Self-pay | Admitting: Obstetrics & Gynecology

## 2018-09-13 ENCOUNTER — Ambulatory Visit (INDEPENDENT_AMBULATORY_CARE_PROVIDER_SITE_OTHER): Payer: BLUE CROSS/BLUE SHIELD | Admitting: Obstetrics & Gynecology

## 2018-09-13 VITALS — BP 124/76 | Resp 16 | Ht 64.0 in | Wt 146.0 lb

## 2018-09-13 DIAGNOSIS — B009 Herpesviral infection, unspecified: Secondary | ICD-10-CM | POA: Diagnosis not present

## 2018-09-13 DIAGNOSIS — Z01419 Encounter for gynecological examination (general) (routine) without abnormal findings: Secondary | ICD-10-CM

## 2018-09-13 MED ORDER — VALACYCLOVIR HCL 500 MG PO TABS: 500.0000 mg | ORAL_TABLET | Freq: Two times a day (BID) | ORAL | 0 refills | Status: DC

## 2018-09-13 NOTE — Progress Notes (Signed)
   Subjective:    Patient ID: Cynthia Weber, female    DOB: 1976/02/01, 43 y.o.   MRN: [id number]  HPI  43 year old female presents to talk about recurrent HSV and her menstrual cycle.  Patient has HSV on buttocks near her sacrum.  First episode was 6 years ago.  It was not recurrent until the summer.  The summer she is began having outbreaks and there are multiple lesions that have occurred.  When she takes Valtrex she becomes nauseated 12 to 24 hours after initiation of the medication.  The 500mg  bid was better tolerated than the 1000 mg daily.  She is tried a topical cream and some herbal remedies.  She is very concerned that is not controlled.  She wonders if there is another treatment other than Valtrex.  Patient is seen her primary care physician as well as a dermatologist.  She has not been on suppressive therapy.  She is only been treated with acute outbreak therapy.  Patient has not tried acyclovir.  She is only tried valacyclovir. Patient had a myomectomy.  Pt has not been tested for HIV and is reluctant to test at this time.    Review of Systems  HENT:       Recently had flu--symptms are better  Gastrointestinal: Negative.   Genitourinary: Negative.   Skin:       Lesions on buttocks since summer       Objective:   Physical Exam Vitals signs reviewed.  Constitutional:      General: She is not in acute distress.    Appearance: She is well-developed.  HENT:     Head: Normocephalic and atraumatic.  Eyes:     Conjunctiva/sclera: Conjunctivae normal.  Cardiovascular:     Rate and Rhythm: Normal rate.  Pulmonary:     Effort: Pulmonary effort is normal.  Skin:    General: Skin is warm and dry.  Neurological:     Mental Status: She is alert and oriented to person, place, and time.             Assessment & Plan:  43 year old female recurrent HSV on her buttocks.  Her primary care provider does have a culture positive for HSV.  The lesions have continued since  summer.  Patient gets nauseated and vomits 12 to 24 hours after taking out acyclovir.  Patient does not have any new recent sexual contacts.  Patient has not been tested for HIV.  Patient is thinking of dropping her insurance.  Her deductible is very high and her monthly out-of-pocket is also very high.  She wanted to discuss her menstrual cycle in case this occurs.  Patient is having a 3 to 4-day menstrual cycle that is not heavy.  It is brown at time and patient was reassured.  We will ask ID physicians if there is another therapy that we can initiate for the HSV.  I think possibly being on Valtrex 500 mg daily could be a good option to suppress outbreaks as well as be something she could tolerate.  Recheck ID recommendations.  Patient opted out of HIV testing today.    25 minutes spent face-to-face with patient with greater than 50% counseling.

## 2018-09-16 ENCOUNTER — Other Ambulatory Visit: Payer: Self-pay | Admitting: *Deleted

## 2018-09-16 MED ORDER — VALACYCLOVIR HCL 500 MG PO TABS: 500.0000 mg | ORAL_TABLET | Freq: Two times a day (BID) | ORAL | 3 refills | Status: DC

## 2018-09-23 ENCOUNTER — Other Ambulatory Visit: Payer: Self-pay | Admitting: Obstetrics & Gynecology

## 2018-09-23 ENCOUNTER — Ambulatory Visit (INDEPENDENT_AMBULATORY_CARE_PROVIDER_SITE_OTHER): Payer: BLUE CROSS/BLUE SHIELD

## 2018-09-23 DIAGNOSIS — Z1231 Encounter for screening mammogram for malignant neoplasm of breast: Secondary | ICD-10-CM

## 2018-09-23 DIAGNOSIS — Z01419 Encounter for gynecological examination (general) (routine) without abnormal findings: Secondary | ICD-10-CM

## 2018-09-29 ENCOUNTER — Ambulatory Visit (INDEPENDENT_AMBULATORY_CARE_PROVIDER_SITE_OTHER): Payer: BLUE CROSS/BLUE SHIELD

## 2018-09-29 DIAGNOSIS — Z113 Encounter for screening for infections with a predominantly sexual mode of transmission: Secondary | ICD-10-CM

## 2018-09-29 NOTE — Progress Notes (Signed)
Pt here for blood work.

## 2018-09-30 LAB — HIV ANTIBODY (ROUTINE TESTING W REFLEX): HIV 1&2 Ab, 4th Generation: NONREACTIVE

## 2018-10-13 ENCOUNTER — Telehealth: Payer: Self-pay

## 2018-10-13 ENCOUNTER — Encounter: Payer: Self-pay | Admitting: Osteopathic Medicine

## 2018-10-13 NOTE — Telephone Encounter (Signed)
Attempted to call pt to cancel appt due to COVID-19. Pt did not answer. I left a voicemail and sent a MyChart message.

## 2018-10-14 ENCOUNTER — Other Ambulatory Visit: Payer: Self-pay

## 2018-10-14 ENCOUNTER — Encounter: Payer: Self-pay | Admitting: Sports Medicine

## 2018-10-14 ENCOUNTER — Ambulatory Visit (INDEPENDENT_AMBULATORY_CARE_PROVIDER_SITE_OTHER): Payer: BLUE CROSS/BLUE SHIELD | Admitting: Sports Medicine

## 2018-10-14 DIAGNOSIS — R200 Anesthesia of skin: Secondary | ICD-10-CM

## 2018-10-14 DIAGNOSIS — R202 Paresthesia of skin: Secondary | ICD-10-CM

## 2018-10-14 NOTE — Assessment & Plan Note (Addendum)
Cynthia Weber has been through a lot. Clinically her symptoms resemble carpal tunnel syndrome, she did have a nerve conduction study that was consistent with carpal tunnel on the right but negative on the left but nerve conduction EMG does have limited sensitivity. We did bilateral nighttime splinting. She also tried anti-inflammatory diet. In the meantime over the past 6 months she has seen a vascular surgeon and a pain doctor, was diagnosed with thoracic outlet syndrome. She has had scalene blocks with lidocaine as well as a steroid scalene block with minimal relief, . Ultimately she would have been a candidate for first rib excision. She however wants to consider revisiting carpal tunnel syndrome, I did bilateral median nerve Hydro dissections today. She will return back to see me in 1 month.

## 2018-10-14 NOTE — Progress Notes (Signed)
Subjective:    CC: Recheck hand numbness and tingling  HPI: Cynthia Weber has been through a lot.  Clinically her symptoms resembled carpal tunnel syndrome, she did have a nerve conduction study that was consistent with carpal tunnel on the right but negative on the left but nerve conduction EMG does have limited sensitivity.  We did bilateral nighttime splinting.  She also tried anti-inflammatory diet.  In the meantime over the past 6 months she has seen a vascular surgeon and a pain doctor, was diagnosed with thoracic outlet syndrome.  She has had scalene blocks with lidocaine as well as a steroid scalene block with minimal relief.  Ultimately she would have been a candidate for first rib excision.  She however wants to consider revisiting carpal tunnel syndrome.  Symptoms are moderate, persistent, with radiation to the hand and proximal to the elbow.  Her whole hand goes numb bilaterally at night.  I reviewed the past medical history, family history, social history, surgical history, and allergies today and no changes were needed.  Please see the problem list section below in epic for further details.  Past Medical History: Past Medical History:  Diagnosis Date  . Anemia   . SVD (spontaneous vaginal delivery) 2000   x 1   Past Surgical History: Past Surgical History:  Procedure Laterality Date  . dental implant     lower back right  . MYOMECTOMY N/A 11/12/2017   Procedure: ABDOMINAL MYOMECTOMY WITH RIGHT TUBAL CYSTECTOMY;  Surgeon: Guss Bunde, MD;  Location: [facility];  Service: Gynecology;  Laterality: N/A;  . PRIMARY CLOSURE N/A 11/12/2017   Procedure: ASSISTING WITH CLOSURE OF ABDOMINAL INCISION AFTER FIBROIDECTOMY;  Surgeon: Wallace Going, DO;  Location: [facility];  Service: Plastics;  Laterality: N/A;  . SINUS SURGERY WITH INSTATRAK  02/27/2017   Dr Benjamine Mola -[facility]  . tummy tuck  2014   Dr. Ophia Coup at [facility]  . WISDOM TOOTH EXTRACTION     Social History: Social History    Socioeconomic History  . Marital status: Divorced    Spouse name: Not on file  . Number of children: Not on file  . Years of education: Not on file  . Highest education level: Not on file  Occupational History  . Not on file  Social Needs  . Financial resource strain: Not on file  . Food insecurity:    Worry: Not on file    Inability: Not on file  . Transportation needs:    Medical: Not on file    Non-medical: Not on file  Tobacco Use  . Smoking status: Never Smoker  . Smokeless tobacco: Never Used  Substance and Sexual Activity  . Alcohol use: No    Frequency: Never  . Drug use: No  . Sexual activity: Yes    Birth control/protection: None  Lifestyle  . Physical activity:    Days per week: Not on file    Minutes per session: Not on file  . Stress: Not on file  Relationships  . Social connections:    Talks on phone: Not on file    Gets together: Not on file    Attends religious service: Not on file    Active member of club or organization: Not on file    Attends meetings of clubs or organizations: Not on file    Relationship status: Not on file  Other Topics Concern  . Not on file  Social History Narrative  . Not on file   Family History: Family History  Problem Relation Age of Onset  . Thyroid cancer Mother   . Heart attack Father    Allergies: Allergies  Allergen Reactions  . Valacyclovir Nausea And Vomiting   Medications: See med rec.  Review of Systems: No fevers, chills, night sweats, weight loss, chest pain, or shortness of breath.   Objective:    General: Well Developed, well nourished, and in no acute distress.  Neuro: Alert and oriented x3, extra-ocular muscles intact, sensation grossly intact.  HEENT: Normocephalic, atraumatic, pupils equal round reactive to light, neck supple, no masses, no lymphadenopathy, thyroid nonpalpable.  Skin: Warm and dry, no rashes. Cardiac: Regular rate and rhythm, no murmurs rubs or gallops, no lower extremity  edema.  Respiratory: Clear to auscultation bilaterally. Not using accessory muscles, speaking in full sentences.  Procedure: Real-time Ultrasound Guided hydrodissection of the left median nerve at the carpal tunnel Device: GE Logiq E  Verbal informed consent obtained.  Time-out conducted.  Noted no overlying erythema, induration, or other signs of local infection.  Skin prepped in a sterile fashion.  Local anesthesia: Topical Ethyl chloride.  With sterile technique and under real time ultrasound guidance: Using a 25-gauge needle advanced into the carpal tunnel, taking care to avoid intraneural injection I injected medication both superficial to and deep to the median nerve freeing it from surrounding structures, I then redirected the needle deep and injected further medication around the flexor tendons deep within the carpal tunnel for a total of 1 cc kenalog 40, 5 cc lidocaine. Completed without difficulty  Advised to call if fevers/chills, erythema, induration, drainage, or persistent bleeding.  Images permanently stored and available for review in the ultrasound unit.  Impression: Technically successful ultrasound guided median nerve hydrodissection.  Procedure: Real-time Ultrasound Guided hydrodissection of the right median nerve at the carpal tunnel Device: GE Logiq E  Verbal informed consent obtained.  Time-out conducted.  Noted no overlying erythema, induration, or other signs of local infection.  Skin prepped in a sterile fashion.  Local anesthesia: Topical Ethyl chloride.  With sterile technique and under real time ultrasound guidance: Using a 25-gauge needle advanced into the carpal tunnel, taking care to avoid intraneural injection I injected medication both superficial to and deep to the median nerve freeing it from surrounding structures, I then redirected the needle deep and injected further medication around the flexor tendons deep within the carpal tunnel for a total of 1 cc  kenalog 40, 5 cc lidocaine. Completed without difficulty  Advised to call if fevers/chills, erythema, induration, drainage, or persistent bleeding.  Images permanently stored and available for review in the ultrasound unit.  Impression: Technically successful ultrasound guided median nerve hydrodissection.  Impression and Recommendations:    Numbness and tingling in both hands Cynthia Weber has been through a lot. Clinically her symptoms resemble carpal tunnel syndrome, she did have a nerve conduction study that was consistent with carpal tunnel on the right but negative on the left but nerve conduction EMG does have limited sensitivity. We did bilateral nighttime splinting. She also tried anti-inflammatory diet. In the meantime over the past 6 months she has seen a vascular surgeon and a pain doctor, was diagnosed with thoracic outlet syndrome. She has had scalene blocks with lidocaine as well as a steroid scalene block with minimal relief, . Ultimately she would have been a candidate for first rib excision. She however wants to consider revisiting carpal tunnel syndrome, I did bilateral median nerve Hydro dissections today. She will return back to see me in 1  month.   ___________________________________________ Gwen Her. Dianah Field, M.D., ABFM., CAQSM. Primary Care and Sports Medicine [facility]  Adjunct Professor of [employer]

## 2018-10-15 ENCOUNTER — Encounter: Payer: Self-pay | Admitting: Sports Medicine

## 2018-10-15 ENCOUNTER — Ambulatory Visit: Payer: BLUE CROSS/BLUE SHIELD | Admitting: Sports Medicine

## 2018-10-18 ENCOUNTER — Other Ambulatory Visit: Payer: Self-pay

## 2018-10-18 ENCOUNTER — Encounter: Payer: Self-pay | Admitting: Sports Medicine

## 2018-10-18 ENCOUNTER — Ambulatory Visit: Payer: BLUE CROSS/BLUE SHIELD | Admitting: Obstetrics & Gynecology

## 2018-10-18 ENCOUNTER — Ambulatory Visit (INDEPENDENT_AMBULATORY_CARE_PROVIDER_SITE_OTHER): Payer: BLUE CROSS/BLUE SHIELD | Admitting: Sports Medicine

## 2018-10-18 DIAGNOSIS — R202 Paresthesia of skin: Secondary | ICD-10-CM

## 2018-10-18 DIAGNOSIS — R200 Anesthesia of skin: Secondary | ICD-10-CM | POA: Diagnosis not present

## 2018-10-18 NOTE — Assessment & Plan Note (Signed)
Carpal tunnel symptoms for the most part resolved after median nerve Hydro dissection. 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 Return to see me in a month.

## 2018-10-18 NOTE — Progress Notes (Signed)
Subjective:    CC: Hand numbness and tingling  HPI: Memori returns, she is a pleasant 43 year old female, she has been through a lot in terms of diagnostics, she was diagnosed with thoracic outlet syndrome, she had some scalene blocks with lidocaine as well as steroid with really only minimal relief.  Because of the minimal relief she was not deemed a candidate for first rib excision.  Clinically we suspected more cubital and carpal tunnel syndrome, at the last visit I did a bilateral median nerve hydrodissection and she had essentially complete resolution of her hand paresthesias.  She continues to have paresthesias into the pinky, worse when keeping her elbow bent such as when talking on the phone.  Symptoms are moderate, persistent, localized from the elbow radiating down to the fourth and fifth fingers.  I reviewed the past medical history, family history, social history, surgical history, and allergies today and no changes were needed.  Please see the problem list section below in epic for further details.  Past Medical History: Past Medical History:  Diagnosis Date  . Anemia   . SVD (spontaneous vaginal delivery) 2000   x 1   Past Surgical History: Past Surgical History:  Procedure Laterality Date  . dental implant     lower back right  . MYOMECTOMY N/A 11/12/2017   Procedure: ABDOMINAL MYOMECTOMY WITH RIGHT TUBAL CYSTECTOMY;  Surgeon: Guss Bunde, MD;  Location: [facility];  Service: Gynecology;  Laterality: N/A;  . PRIMARY CLOSURE N/A 11/12/2017   Procedure: ASSISTING WITH CLOSURE OF ABDOMINAL INCISION AFTER FIBROIDECTOMY;  Surgeon: Wallace Going, DO;  Location: [facility];  Service: Plastics;  Laterality: N/A;  . SINUS SURGERY WITH INSTATRAK  02/27/2017   Dr Benjamine Mola -[facility]  . tummy tuck  2014   [facility]. Anola [facility]oup at [facility]  . WISDOM TOOTH EXTRACTION     Social History: Social History   Socioeconomic History  . Marital status: Divorced    Spouse name: Not on file  . Number of  children: Not on file  . Years of education: Not on file  . Highest education level: Not on file  Occupational History  . Not on file  Social Needs  . Financial resource strain: Not on file  . Food insecurity:    Worry: Not on file    Inability: Not on file  . Transportation needs:    Medical: Not on file    Non-medical: Not on file  Tobacco Use  . Smoking status: Never Smoker  . Smokeless tobacco: Never Used  Substance and Sexual Activity  . Alcohol use: No    Frequency: Never  . Drug use: No  . Sexual activity: Yes    Birth control/protection: None  Lifestyle  . Physical activity:    Days per week: Not on file    Minutes per session: Not on file  . Stress: Not on file  Relationships  . Social connections:    Talks on phone: Not on file    Gets together: Not on file    Attends religious service: Not on file    Active member of club or organization: Not on file    Attends meetings of clubs or organizations: Not on file    Relationship status: Not on file  Other Topics Concern  . Not on file  Social History Narrative  . Not on file   Family History: Family History  Problem Relation Age of Onset  . Thyroid cancer Mother   . Heart attack Father  Allergies: Allergies  Allergen Reactions  . Valacyclovir Nausea And Vomiting   Medications: See med rec.  Review of Systems: No fevers, chills, night sweats, weight loss, chest pain, or shortness of breath.   Objective:    General: Well Developed, well nourished, and in no acute distress.  Neuro: Alert and oriented x3, extra-ocular muscles intact, sensation grossly intact.  HEENT: Normocephalic, atraumatic, pupils equal round reactive to light, neck supple, no masses, no lymphadenopathy, thyroid nonpalpable.  Skin: Warm and dry, no rashes. Cardiac: Regular rate and rhythm, no murmurs rubs or gallops, no lower extremity edema.  Respiratory: Clear to auscultation bilaterally. Not using accessory muscles, speaking in  full sentences. Bilateral elbows: Unremarkable to inspection. Range of motion full pronation, supination, flexion, extension. Strength is full to all of the above directions Stable to varus, valgus stress. Negative moving valgus stress test. No discrete areas of tenderness to palpation. Ulnar nerve does not sublux. Bilateral positive cubital tunnel Tinel's.  Procedure: Real-time Ultrasound Guided hydrodissection of the right ulnar nerve at the cubital tunnel Device: GE Logiq E  Verbal informed consent obtained.  Time-out conducted.  Noted no overlying erythema, induration, or other signs of local infection.  Skin prepped in a sterile fashion.  Local anesthesia: Topical Ethyl chloride.  With sterile technique and under real time ultrasound guidance: Using a 25-gauge needle advanced into the cubital tunnel, taking care to avoid intraneural injection I injected medication both superficial to and deep to the ulnar nerve freeing it from surrounding structures for a total of 0.5 cc kenalog 40, 5 cc lidocaine. Completed without difficulty  Advised to call if fevers/chills, erythema, induration, drainage, or persistent bleeding.  Images permanently stored and available for review in the ultrasound unit.  Impression: Technically successful ultrasound guided median nerve hydrodissection.  Procedure: Real-time Ultrasound Guided hydrodissection of the left ulnar nerve at the cubital tunnel Device: GE Logiq E  Verbal informed consent obtained.  Time-out conducted.  Noted no overlying erythema, induration, or other signs of local infection.  Skin prepped in a sterile fashion.  Local anesthesia: Topical Ethyl chloride.  With sterile technique and under real time ultrasound guidance: Using a 25-gauge needle advanced into the cubital tunnel, taking care to avoid intraneural injection I injected medication both superficial to and deep to the ulnar nerve freeing it from surrounding structures for a total  of 0.5 cc kenalog 40, 5 cc lidocaine. Completed without difficulty  Advised to call if fevers/chills, erythema, induration, drainage, or persistent bleeding.  Images permanently stored and available for review in the ultrasound unit.  Impression: Technically successful ultrasound guided median nerve hydrodissection.  Impression and Recommendations:    Numbness and tingling in both hands Carpal tunnel symptoms for the most part resolved after median nerve Hydro dissection. 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 Return to see me in a month.    ___________________________________________ Gwen Her. Dianah Field, M.D., ABFM., CAQSM. Primary Care and Sports Medicine [facility]  Adjunct Professor of [place] [employer]

## 2018-10-21 ENCOUNTER — Ambulatory Visit: Payer: BLUE CROSS/BLUE SHIELD | Admitting: Osteopathic Medicine

## 2018-10-21 ENCOUNTER — Ambulatory Visit: Payer: BLUE CROSS/BLUE SHIELD | Admitting: Sports Medicine

## 2018-10-22 ENCOUNTER — Ambulatory Visit: Payer: BLUE CROSS/BLUE SHIELD | Admitting: Sports Medicine

## 2018-10-26 ENCOUNTER — Ambulatory Visit: Payer: BLUE CROSS/BLUE SHIELD | Admitting: Osteopathic Medicine

## 2018-12-29 ENCOUNTER — Telehealth: Payer: Self-pay | Admitting: *Deleted

## 2018-12-29 NOTE — Telephone Encounter (Signed)
Left patient a message to call and reschedule F/U appointment that was cancelled on 10/18/18 due to COVID-19.

## 2019-04-15 ENCOUNTER — Other Ambulatory Visit: Payer: Self-pay | Admitting: Obstetrics & Gynecology

## 2019-04-15 MED ORDER — FAMCICLOVIR 250 MG PO TABS: 250.0000 mg | ORAL_TABLET | Freq: Two times a day (BID) | ORAL | 6 refills | Status: DC

## 2019-04-15 NOTE — Progress Notes (Signed)
Pt experiencing hair loss and on daily valtrex.  Valtrex can have side effect of hair loss.  Reviewed with pharmacy and famciclovir does not have hair loss listed as side effect.  Will change to Famciclovir 250 mg bid.    Pt opts to not have TSH redrawn (was normal earlier this year).

## 2019-10-10 ENCOUNTER — Other Ambulatory Visit: Payer: Self-pay | Admitting: *Deleted

## 2019-10-10 MED ORDER — FAMCICLOVIR 250 MG PO TABS: 250.0000 mg | ORAL_TABLET | Freq: Two times a day (BID) | ORAL | 6 refills | Status: DC

## 2019-10-10 NOTE — Telephone Encounter (Signed)
Pt called requesting that her Famciclovir be switched to [facility] in [place] and would like a 90 day supply.  She does not have insurance coverage right now and can get a discount with the larger [facility].

## 2020-02-17 ENCOUNTER — Encounter: Payer: Self-pay | Admitting: Medical-Surgical

## 2020-02-17 ENCOUNTER — Ambulatory Visit (INDEPENDENT_AMBULATORY_CARE_PROVIDER_SITE_OTHER): Payer: Self-pay | Admitting: Medical-Surgical

## 2020-02-17 VITALS — BP 129/76 | HR 91 | Temp 98.4°F | Wt 151.0 lb

## 2020-02-17 DIAGNOSIS — N898 Other specified noninflammatory disorders of vagina: Secondary | ICD-10-CM

## 2020-02-17 LAB — WET PREP FOR TRICH, YEAST, CLUE
MICRO NUMBER:: 10742273
Specimen Quality: ADEQUATE

## 2020-02-17 MED ORDER — FLUCONAZOLE 150 MG PO TABS: 150.0000 mg | ORAL_TABLET | Freq: Once | ORAL | 1 refills | Status: AC

## 2020-02-17 NOTE — Progress Notes (Signed)
Subjective:    CC: vaginal itching  HPI: Pleasant 44 year old female presenting today with complaints of vaginal itching. Notes that she has had what felt like recurrent yeast infections over the past several weeks. She is sexually active in a monogamous relationship with one female partner who is uncircumcised. After intercourse a few weeks ago, he complained of red irritated skin with buildup under his foreskin. At the time, the patient was asymptomatic but developed vaginal itching over the next couple of days. She treated herself using over the counter products and he treated his symptoms with topical antifungal cream. She reports he only used the cream for a couple of days and when the symptoms improved, he stopped the treatment. Each time after intercourse over the past few weeks, both the patient and her partner have had a return of symptoms. She did treat with Monistat at one point with improvement in symptoms. Denies vaginal discharge presently but noted that she did have some thicker white discharge on her finger after inserting it in her vagina. No vaginal odor, burning, or dyspareunia. No abnormal vaginal bleeding or urinary symptoms. Does not use condoms.   I reviewed the past medical history, family history, social history, surgical history, and allergies today and no changes were needed.  Please see the problem list section below in epic for further details.  Past Medical History: Past Medical History:  Diagnosis Date  . Anemia   . SVD (spontaneous vaginal delivery) 2000   x 1   Past Surgical History: Past Surgical History:  Procedure Laterality Date  . dental implant     lower back right  . MYOMECTOMY N/A 11/12/2017   Procedure: ABDOMINAL MYOMECTOMY WITH RIGHT TUBAL CYSTECTOMY;  Surgeon: Guss Bunde, MD;  Location: [facility];  Service: Gynecology;  Laterality: N/A;  . PRIMARY CLOSURE N/A 11/12/2017   Procedure: ASSISTING WITH CLOSURE OF ABDOMINAL INCISION AFTER FIBROIDECTOMY;   Surgeon: Wallace Going, DO;  Location: [facility];  Service: Plastics;  Laterality: N/A;  . SINUS SURGERY WITH INSTATRAK  02/27/2017   Dr Benjamine Mola -[facility]  . tummy tuck  2014   Dr. Minnah Coup at [facility]  . WISDOM TOOTH EXTRACTION     Social History: Social History   Socioeconomic History  . Marital status: Divorced    Spouse name: Not on file  . Number of children: Not on file  . Years of education: Not on file  . Highest education level: Not on file  Occupational History  . Not on file  Tobacco Use  . Smoking status: Never Smoker  . Smokeless tobacco: Never Used  Vaping Use  . Vaping Use: Never used  Substance and Sexual Activity  . Alcohol use: No  . Drug use: No  . Sexual activity: Yes    Birth control/protection: None  Other Topics Concern  . Not on file  Social History Narrative  . Not on file   Social Determinants of Health   Financial Resource Strain:   . Difficulty of Paying Living Expenses:   Food Insecurity:   . Worried About Charity fundraiser in the Last Year:   . Arboriculturist in the Last Year:   Transportation Needs:   . Film/video editor (Medical):   Marland Kitchen Lack of Transportation (Non-Medical):   Physical Activity:   . Days of Exercise per Week:   . Minutes of Exercise per Session:   Stress:   . Feeling of Stress :   Social Connections:   . Frequency  of Communication with Friends and Family:   . Frequency of Social Gatherings with Friends and Family:   . Attends Religious Services:   . Active Member of Clubs or Organizations:   . Attends Archivist Meetings:   Marland Kitchen Marital Status:    Family History: Family History  Problem Relation Age of Onset  . Thyroid cancer Mother   . Heart attack Father    Allergies: Allergies  Allergen Reactions  . Valacyclovir Nausea And Vomiting   Medications: See med rec.  Review of Systems: See HPI for pertinent positives and negatives.   Objective:    General: Well Developed, well nourished, and in no  acute distress.  Neuro: Alert and oriented x3.  HEENT: Normocephalic, atraumatic.  Skin: Warm and dry. Cardiac: Regular rate and rhythm, no lower extremity edema.  Respiratory: Not using accessory muscles, speaking in full sentences. Pelvic exam: VULVA: normal appearing vulva with no masses, tenderness or lesions, VAGINA: vaginal erythema at the vaginal vault, vaginal discharge - white and thick, WET MOUNT done - results: pending, exam chaperoned by Shaune Pascal, MA.   Impression and Recommendations:    1. Vaginal discharge Suspect itching is related to vulvovaginal candidiasis. With partner not treating his symptoms with the full recommended course, they are likely reinfecting each other. Sending wet prep for confirmation of yeast. Will treat with Diflucan if positive. Strongly recommend advising her partner to treat with the full 7 days of BID topical treatment to help eradicate symptoms completely. Avoid intercourse or use condoms until he has been effectively treated. - WET PREP FOR [place]  Return if symptoms worsen or fail to improve. ___________________________________________ Clearnce Sorrel, DNP, APRN, FNP-BC Primary Care and [place]

## 2020-02-17 NOTE — Addendum Note (Signed)
Addended bySamuel Bouche on: 02/17/2020 11:43 AM   Modules accepted: Orders

## 2020-07-18 ENCOUNTER — Encounter: Payer: Self-pay | Admitting: Medical-Surgical

## 2020-07-19 MED ORDER — FLUCONAZOLE 150 MG PO TABS
150.0000 mg | ORAL_TABLET | Freq: Once | ORAL | 1 refills | Status: AC
Start: 2020-07-19 — End: 2020-07-19

## 2020-07-31 ENCOUNTER — Telehealth: Payer: Self-pay | Admitting: Emergency Medicine

## 2020-07-31 DIAGNOSIS — A6000 Herpesviral infection of urogenital system, unspecified: Secondary | ICD-10-CM

## 2020-07-31 MED ORDER — FAMCICLOVIR 250 MG PO TABS: 1000.0000 mg | ORAL_TABLET | Freq: Two times a day (BID) | ORAL | 0 refills | Status: AC

## 2020-07-31 NOTE — Progress Notes (Signed)
E-Visit for Herpes Simplex  We are sorry that you are not feeling well.  Here is how we plan to help!  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    I have prescribed famyclovir 1000mg  twice daily for one day.    Regarding your increasingly frequent outbreaks, I recommend that you follow-up with infectious disease.   [facility]  [address], [place], [place]  ~5.6 mi [phone]  If you have been prescribed long term medications to be taken on a regular basis, it is important to follow the recommendations and take them as ordered.    Outbreaks usually include blisters and open sores in the genital area. Outbreaks that happen after the first time are usually not as severe and do not last as long. Genital Herpes Simplex is a commonly sexually transmitted viral infection that is found worldwide. Most of these genital infections are caused by one or two herpes simplex viruses that is passed from person to person during vaginal, oral, or anal sex. Sometimes, people do not know they have herpes because they do not have any symptoms.  Please be aware that if you have genital herpes you can be contagious even when you are not having rash or flare-up and you may not have any symptoms, even when you are taking suppressive medicines.  Herpes cannot be cured. The disease usually causes most problems during the first few years. After that, the virus is still there, but it causes few to no symptoms. Even when the virus is active, people with herpes can take medicines to reduce and help prevent symptoms.  Herpes is an infection that can cause blisters and open sores on the genital area. Herpes is caused by a virus that is passed from person to person during vaginal, oral, or anal sex. Sometimes, people do not know they have herpes because they do not have any symptoms. Herpes cannot be cured. The  disease usually causes most problems during the first few years. After that, the virus is still there, but it causes few to no symptoms. Even when the virus is active, people with herpes can take medicines to reduce and help prevent symptoms.  If you have been prescribed medications to be taken on a regular basis, it is important to follow the recommendations and take them as ordered.  Some people with herpes never have any symptoms. But other people can develop symptoms within a few weeks of being infected with the herpes virus   Symptoms usually include blisters in the genital area. In women, this area includes the vagina, buttocks, anus, or thighs. In men, this area includes the penis, scrotum, anus, butt, or thighs. The blisters can become painful open sores, which then crust over as they heal. Sometimes, people can have other symptoms that include:  ?Blisters on the mouth or lips ?Fever, headache, or pain in the joints ?Trouble urinating  Outbreaks might occur every month or more often, or just once or twice a year. Sometimes, people can tell when an outbreak will occur, because they feel itching or pain beforehand. Sometimes they do not know that an outbreak is coming because they have no symptoms. Whatever your pattern is, keep in mind that herpes outbreaks usually become less frequent over time as you get older. Certain things, called "triggers," can make outbreaks more likely to occur. These include stress, sunlight, menstrual periods,or  getting sick.  Antiviral therapy can shorten the duration of symptoms and signs in primary infection, which, when untreated, can be associated with significant increase in the symptoms of the disease.  HOME CARE . Use a portable bath (such as a "Sitz bath") where you can sit in warm water for about 20 minutes. Your bathtub could also work. Avoid bubble baths.  . Keep the genital area clean and dry and avoid tight clothes.  . Take over-the-counter pain  medicine such as acetaminophen (brand name: Tylenol) or ibuprofen sample brand names: Advil, Motrin). But avoid aspirin.  . Only take medications as instructed by your medical team.  . You are most likely to spread herpes to a sex partner when you have blisters and open sores on your body. But it's also possible to spread herpes to your partner when you do not have any symptoms. That is because herpes can be present on your body without causing any symptoms, like blisters or pain.  . Telling your sex partner that you have herpes can be hard. But it can help protect them, since there are ways to lower the risk of spreading the infection.   . Using a condom every time you have sex  . Not having sex when you have symptoms  . Not having oral sex if you have blisters or open sores (in the genital area or around your mouth)  MAKE SURE YOU   . Understand these instructions. . Do not have sex without using a condom until you have been seen by a doctor and as instructed by the provider . If you are not better or improved within 7 days, you MUST have a follow up at your doctor or the health department for evaluation. There are other causes of rashes in the genital region.  Thank you for choosing an e-visit. Your e-visit answers were reviewed by a board certified advanced clinical practitioner to complete your personal care plan. Depending upon the condition, your plan could have included both over the counter or prescription medications.  Please review your pharmacy choice. Make sure the pharmacy is open so you can pick up prescription now. If there is a problem, you may contact your provider through [employer] and have the prescription routed to another pharmacy.  Your safety is important to [place]. If you have drug allergies check your prescription carefully.   For the next 24 hours you can use MyChart to ask questions about today's visit, request a non-urgent call back, or ask for a work or  school excuse. You will get an email in the next two days asking about your experience. I hope that your e-visit has been valuable and will speed your recovery         Approximately 5 minutes was used in reviewing the patient's chart, questionnaire, prescribing medications, and documentation.

## 2020-07-31 NOTE — Addendum Note (Signed)
Addended by: Bennie Pierini on: 07/31/2020 05:21 PM   Modules accepted: Orders

## 2021-01-10 ENCOUNTER — Telehealth: Payer: Self-pay | Admitting: Physician Assistant

## 2021-01-10 DIAGNOSIS — B373 Candidiasis of vulva and vagina: Secondary | ICD-10-CM

## 2021-01-10 DIAGNOSIS — B3731 Acute candidiasis of vulva and vagina: Secondary | ICD-10-CM

## 2021-01-10 MED ORDER — FLUCONAZOLE 150 MG PO TABS: 150.0000 mg | ORAL_TABLET | Freq: Once | ORAL | 0 refills | Status: DC

## 2021-01-10 MED ORDER — FLUCONAZOLE 150 MG PO TABS: 150.0000 mg | ORAL_TABLET | Freq: Once | ORAL | 0 refills | Status: AC

## 2021-01-10 NOTE — Progress Notes (Signed)

## 2021-01-10 NOTE — Progress Notes (Signed)
I have spent 5 minutes in review of e-visit questionnaire, review and updating patient chart, medical decision making and response to patient.   Corrie Brannen Cody Jeric Slagel, PA-C    

## 2021-01-10 NOTE — Addendum Note (Signed)
Addended by: Mar Daring on: 01/10/2021 11:47 AM   Modules accepted: Orders
